# Patient Record
Sex: Male | Born: 1950 | Race: White | Hispanic: No | Marital: Married | Smoking: Never smoker
Health system: Southern US, Community
[De-identification: ages and names within clinical notes are randomized; demographics above are authoritative.]

## PROBLEM LIST (undated history)

## (undated) DIAGNOSIS — M7989 Other specified soft tissue disorders: Secondary | ICD-10-CM

## (undated) DIAGNOSIS — L0390 Cellulitis, unspecified: Secondary | ICD-10-CM

## (undated) DIAGNOSIS — F1011 Alcohol abuse, in remission: Secondary | ICD-10-CM

## (undated) DIAGNOSIS — Z860101 Personal history of adenomatous and serrated colon polyps: Secondary | ICD-10-CM

## (undated) DIAGNOSIS — F1010 Alcohol abuse, uncomplicated: Secondary | ICD-10-CM

## (undated) DIAGNOSIS — L111 Transient acantholytic dermatosis [Grover]: Secondary | ICD-10-CM

## (undated) DIAGNOSIS — T7840XA Allergy, unspecified, initial encounter: Secondary | ICD-10-CM

## (undated) DIAGNOSIS — K4021 Bilateral inguinal hernia, without obstruction or gangrene, recurrent: Secondary | ICD-10-CM

## (undated) DIAGNOSIS — K649 Unspecified hemorrhoids: Secondary | ICD-10-CM

## (undated) DIAGNOSIS — Z8489 Family history of other specified conditions: Secondary | ICD-10-CM

## (undated) DIAGNOSIS — Z7901 Long term (current) use of anticoagulants: Secondary | ICD-10-CM

## (undated) DIAGNOSIS — Z86010 Personal history of colonic polyps: Secondary | ICD-10-CM

## (undated) DIAGNOSIS — N401 Enlarged prostate with lower urinary tract symptoms: Secondary | ICD-10-CM

## (undated) DIAGNOSIS — B019 Varicella without complication: Secondary | ICD-10-CM

## (undated) DIAGNOSIS — G2581 Restless legs syndrome: Secondary | ICD-10-CM

## (undated) DIAGNOSIS — I4891 Unspecified atrial fibrillation: Secondary | ICD-10-CM

## (undated) DIAGNOSIS — C44729 Squamous cell carcinoma of skin of left lower limb, including hip: Secondary | ICD-10-CM

## (undated) DIAGNOSIS — L409 Psoriasis, unspecified: Secondary | ICD-10-CM

## (undated) DIAGNOSIS — M1990 Unspecified osteoarthritis, unspecified site: Secondary | ICD-10-CM

## (undated) DIAGNOSIS — B029 Zoster without complications: Secondary | ICD-10-CM

## (undated) DIAGNOSIS — L719 Rosacea, unspecified: Secondary | ICD-10-CM

## (undated) DIAGNOSIS — Z85828 Personal history of other malignant neoplasm of skin: Secondary | ICD-10-CM

## (undated) HISTORY — PX: MOHS SURGERY: SUR867

## (undated) HISTORY — DX: Allergy, unspecified, initial encounter: T78.40XA

## (undated) HISTORY — PX: TONSILLECTOMY AND ADENOIDECTOMY: SUR1326

## (undated) HISTORY — DX: Zoster without complications: B02.9

## (undated) HISTORY — PX: VEIN LIGATION: SHX2652

## (undated) HISTORY — DX: Varicella without complication: B01.9

## (undated) HISTORY — PX: APPENDECTOMY: SHX54

## (undated) HISTORY — DX: Rosacea, unspecified: L71.9

## (undated) HISTORY — PX: COLONOSCOPY: SHX174

## (undated) HISTORY — DX: Personal history of adenomatous and serrated colon polyps: Z86.0101

## (undated) HISTORY — DX: Personal history of colonic polyps: Z86.010

## (undated) HISTORY — DX: Personal history of other malignant neoplasm of skin: Z85.828

## (undated) HISTORY — DX: Cellulitis, unspecified: L03.90

## (undated) HISTORY — PX: POLYPECTOMY: SHX149

## (undated) HISTORY — DX: Psoriasis, unspecified: L40.9

## (undated) HISTORY — DX: Transient acantholytic dermatosis (grover): L11.1

---

## 1998-10-04 ENCOUNTER — Ambulatory Visit (HOSPITAL_COMMUNITY): Admission: RE | Admit: 1998-10-04 | Discharge: 1998-10-04 | Payer: Self-pay | Admitting: Unknown Physician Specialty

## 1999-01-16 ENCOUNTER — Encounter: Admission: RE | Admit: 1999-01-16 | Discharge: 1999-01-18 | Payer: Self-pay

## 2000-06-12 ENCOUNTER — Encounter: Admission: RE | Admit: 2000-06-12 | Discharge: 2000-06-12 | Payer: Self-pay | Admitting: Otolaryngology

## 2000-06-12 ENCOUNTER — Encounter: Payer: Self-pay | Admitting: Otolaryngology

## 2001-05-01 ENCOUNTER — Emergency Department (HOSPITAL_COMMUNITY): Admission: EM | Admit: 2001-05-01 | Discharge: 2001-05-01 | Payer: Self-pay | Admitting: Emergency Medicine

## 2001-06-15 ENCOUNTER — Encounter: Admission: RE | Admit: 2001-06-15 | Discharge: 2001-09-13 | Payer: Self-pay

## 2001-07-21 ENCOUNTER — Encounter: Admission: RE | Admit: 2001-07-21 | Discharge: 2001-08-11 | Payer: Self-pay | Admitting: Orthopedic Surgery

## 2003-01-10 ENCOUNTER — Encounter: Admission: RE | Admit: 2003-01-10 | Discharge: 2003-02-10 | Payer: Self-pay | Admitting: Orthopedic Surgery

## 2003-01-31 ENCOUNTER — Encounter: Payer: Self-pay | Admitting: Orthopedic Surgery

## 2003-01-31 ENCOUNTER — Ambulatory Visit (HOSPITAL_COMMUNITY): Admission: RE | Admit: 2003-01-31 | Discharge: 2003-01-31 | Payer: Self-pay | Admitting: Orthopedic Surgery

## 2004-10-07 ENCOUNTER — Emergency Department (HOSPITAL_COMMUNITY): Admission: EM | Admit: 2004-10-07 | Discharge: 2004-10-07 | Payer: Self-pay | Admitting: Family Medicine

## 2004-10-08 ENCOUNTER — Ambulatory Visit: Payer: Self-pay | Admitting: Internal Medicine

## 2005-02-21 ENCOUNTER — Ambulatory Visit (HOSPITAL_COMMUNITY): Admission: RE | Admit: 2005-02-21 | Discharge: 2005-02-21 | Payer: Self-pay | Admitting: Urology

## 2005-11-19 ENCOUNTER — Ambulatory Visit: Payer: Self-pay | Admitting: Internal Medicine

## 2005-11-22 ENCOUNTER — Ambulatory Visit: Payer: Self-pay | Admitting: Gastroenterology

## 2006-02-19 ENCOUNTER — Ambulatory Visit (HOSPITAL_COMMUNITY): Admission: RE | Admit: 2006-02-19 | Discharge: 2006-02-19 | Payer: Self-pay | Admitting: Gastroenterology

## 2006-02-19 ENCOUNTER — Encounter (INDEPENDENT_AMBULATORY_CARE_PROVIDER_SITE_OTHER): Payer: Self-pay | Admitting: *Deleted

## 2006-02-19 ENCOUNTER — Encounter: Payer: Self-pay | Admitting: Gastroenterology

## 2006-02-21 ENCOUNTER — Ambulatory Visit: Payer: Self-pay | Admitting: Gastroenterology

## 2006-03-31 ENCOUNTER — Emergency Department (HOSPITAL_COMMUNITY): Admission: EM | Admit: 2006-03-31 | Discharge: 2006-03-31 | Payer: Self-pay | Admitting: Family Medicine

## 2006-09-02 ENCOUNTER — Ambulatory Visit: Payer: Self-pay | Admitting: Internal Medicine

## 2006-10-28 HISTORY — PX: HEMORRHOID BANDING: SHX5850

## 2006-11-06 ENCOUNTER — Ambulatory Visit (HOSPITAL_COMMUNITY): Admission: RE | Admit: 2006-11-06 | Discharge: 2006-11-06 | Payer: Self-pay | Admitting: Urology

## 2007-05-24 ENCOUNTER — Emergency Department (HOSPITAL_COMMUNITY): Admission: EM | Admit: 2007-05-24 | Discharge: 2007-05-24 | Payer: Self-pay | Admitting: Family Medicine

## 2007-05-29 ENCOUNTER — Emergency Department (HOSPITAL_COMMUNITY): Admission: EM | Admit: 2007-05-29 | Discharge: 2007-05-29 | Payer: Self-pay | Admitting: Emergency Medicine

## 2007-10-02 ENCOUNTER — Ambulatory Visit: Payer: Self-pay | Admitting: Internal Medicine

## 2007-10-02 LAB — CONVERTED CEMR LAB
Bilirubin Urine: NEGATIVE
Protein, U semiquant: NEGATIVE
Urobilinogen, UA: NEGATIVE
pH: 6.5

## 2008-12-29 ENCOUNTER — Encounter (INDEPENDENT_AMBULATORY_CARE_PROVIDER_SITE_OTHER): Payer: Self-pay | Admitting: *Deleted

## 2009-03-29 ENCOUNTER — Telehealth: Payer: Self-pay | Admitting: Gastroenterology

## 2009-04-27 ENCOUNTER — Ambulatory Visit: Payer: Self-pay | Admitting: Gastroenterology

## 2009-05-04 ENCOUNTER — Encounter: Payer: Self-pay | Admitting: Internal Medicine

## 2009-05-04 ENCOUNTER — Ambulatory Visit: Payer: Self-pay | Admitting: Gastroenterology

## 2009-05-04 ENCOUNTER — Ambulatory Visit (HOSPITAL_COMMUNITY): Admission: RE | Admit: 2009-05-04 | Discharge: 2009-05-04 | Payer: Self-pay | Admitting: Gastroenterology

## 2009-05-04 ENCOUNTER — Encounter: Payer: Self-pay | Admitting: Gastroenterology

## 2009-05-08 ENCOUNTER — Encounter: Payer: Self-pay | Admitting: Gastroenterology

## 2009-06-19 ENCOUNTER — Emergency Department (HOSPITAL_COMMUNITY): Admission: EM | Admit: 2009-06-19 | Discharge: 2009-06-19 | Payer: Self-pay | Admitting: Emergency Medicine

## 2009-06-28 ENCOUNTER — Emergency Department (HOSPITAL_COMMUNITY): Admission: EM | Admit: 2009-06-28 | Discharge: 2009-06-28 | Payer: Self-pay | Admitting: Family Medicine

## 2009-07-27 ENCOUNTER — Ambulatory Visit: Payer: Self-pay | Admitting: Internal Medicine

## 2009-07-27 DIAGNOSIS — G2581 Restless legs syndrome: Secondary | ICD-10-CM

## 2009-07-28 ENCOUNTER — Encounter (INDEPENDENT_AMBULATORY_CARE_PROVIDER_SITE_OTHER): Payer: Self-pay | Admitting: *Deleted

## 2009-10-13 ENCOUNTER — Telehealth (INDEPENDENT_AMBULATORY_CARE_PROVIDER_SITE_OTHER): Payer: Self-pay | Admitting: *Deleted

## 2009-10-17 ENCOUNTER — Encounter: Payer: Self-pay | Admitting: Internal Medicine

## 2010-01-15 ENCOUNTER — Telehealth (INDEPENDENT_AMBULATORY_CARE_PROVIDER_SITE_OTHER): Payer: Self-pay | Admitting: *Deleted

## 2010-08-27 ENCOUNTER — Ambulatory Visit: Payer: Self-pay | Admitting: Internal Medicine

## 2010-08-27 DIAGNOSIS — R209 Unspecified disturbances of skin sensation: Secondary | ICD-10-CM

## 2010-08-28 ENCOUNTER — Encounter: Payer: Self-pay | Admitting: Internal Medicine

## 2010-08-28 ENCOUNTER — Ambulatory Visit (HOSPITAL_COMMUNITY): Admission: RE | Admit: 2010-08-28 | Discharge: 2010-08-28 | Payer: Self-pay | Admitting: Internal Medicine

## 2010-08-28 LAB — CONVERTED CEMR LAB
TSH: 0.89 microintl units/mL (ref 0.35–5.50)
Vitamin B-12: 988 pg/mL — ABNORMAL HIGH (ref 211–911)

## 2010-08-31 ENCOUNTER — Telehealth: Payer: Self-pay | Admitting: Internal Medicine

## 2010-09-12 ENCOUNTER — Encounter
Admission: RE | Admit: 2010-09-12 | Discharge: 2010-10-18 | Payer: Self-pay | Source: Home / Self Care | Attending: Internal Medicine | Admitting: Internal Medicine

## 2010-09-13 ENCOUNTER — Encounter: Payer: Self-pay | Admitting: Internal Medicine

## 2010-10-16 ENCOUNTER — Encounter: Payer: Self-pay | Admitting: Internal Medicine

## 2010-11-27 NOTE — Letter (Signed)
Summary: [facility]  [facility]   Imported By: Lanelle Bal 12/08/2009 09:41:59  _____________________________________________________________________  External Attachment:    Type:   Image     Comment:   External Document

## 2010-11-27 NOTE — Miscellaneous (Signed)
Summary: Orders Update  Clinical Lists Changes  Orders: Added new Service order of EKG w/ Interpretation (93000) - Signed 

## 2010-11-27 NOTE — Assessment & Plan Note (Signed)
Summary: numbness at bend of elbow/cbs   Vital Signs:  Patient profile:   60 year old male Height:      73 inches Weight:      192.4 pounds BMI:     25.48 Temp:     97.6 degrees F oral Pulse rate:   64 / minute Resp:     15 per minute BP sitting:   118 / 84  (left arm) Cuff size:   large  Vitals Entered By: Shonna Chock CMA (August 27, 2010 10:38 AM) CC: Tingling on left side of body(arm/foot/jaw) since Wed, patient denies chest pain, Shoulder pain   CC:  Tingling on left side of body(arm/foot/jaw) since Wed, patient denies chest pain, and Shoulder pain.  History of Present Illness: Tingling LUE  from antecubital area to deltoid  for 5-10 seconds ( in office it traveled forearm for 1st time)  & jaw ( separately & also with LUE symptoms occasionallyfor > 2 years) intermittently  X 5 days. The patient denies numbness, weakness, locking, stiffness, impaired ROM, swelling, and redness.   The pain began without injury.  The pain has no relievers;the pain is worse with  L elbow flexion intermittently. He has had tingling related to using bite block.    Current Medications (verified): 1)  Finasteride 5 Mg  Tabs (Finasteride) .Marland Kitchen.. 1 By Mouth Once Daily Need To Check On Dosage 2)  Multivitamin .... Once Daily 3)  Asa 81mg  .... 1 By Mouth Qd 4)  Pramipexole Dihydrochloride 0.25  Mg Tabs .Marland KitchenMarland Kitchen. 1 At Bedtime 5)  Uroxatral 10 Mg Xr24h-Tab (Alfuzosin Hcl) .Marland Kitchen.. 1 By Mouth Once Daily  Allergies (verified): No Known Drug Allergies  Review of Systems CV:  Denies chest pain or discomfort, lightheadness, and near fainting. GU:  Denies incontinence. Derm:  Denies lesion(s) and rash. Neuro:  Denies brief paralysis and headaches.  Physical Exam  General:  Well-developed,well-nourished,in no acute distress; alert,appropriate and cooperative throughout examination Eyes:  No corneal or conjunctival inflammation noted. EOMI. Perrla. Field of  Vision grossly normal. MINIMAL ptosis OS Mouth:  Oral  mucosa and oropharynx without lesions or exudates.  Teeth in good repair. No tongue deviation Neck:  No deformities, masses, or tenderness noted. Full ROM Lungs:  Normal respiratory effort, chest expands symmetrically. Lungs are clear to auscultation, no crackles or wheezes. Heart:  Normal rate and regular rhythm. Loud S2  @ R base without gallop, murmur, click, rub or other extra sounds. Msk:  No deformity or scoliosis noted of thoracic or lumbar spine but R thoracic muscles > L.   Pulses:  R and L carotid,radial  pulses are full and equal bilaterally Extremities:  No clubbing, cyanosis, edema.Normal full range of motion of  UE Neurologic:  alert & oriented X3, cranial nerves II-XII intact, strength normal in all extremities, sensation intact to light touch, gait normal, and DTRs symmetrical and normal.   Skin:  darier's lesions which blanche  Cervical Nodes:  No lymphadenopathy noted Axillary Nodes:  No palpable lymphadenopathy Psych:  memory intact for recent and remote, normally interactive, and good eye contact.     Impression & Recommendations:  Problem # 1:  TINGLING (ICD-782.0)  T1 distribution X 5 days ; C 2 X 2 years  Orders: T-Cervicle Spine 2-3 Views [id number]) T-Thoracic Spine 2 Views [id number]) Venipuncture [id number]) TLB-TSH (Thyroid Stimulating Hormone) (84443-TSH) TLB-B12, Serum-Total ONLY (81829-H37) T-RPR (Syphilis) (16967-89381)  Complete Medication List: 1)  Finasteride 5 Mg Tabs (Finasteride) .Marland Kitchen.. 1 by mouth once daily need to  check on dosage 2)  Multivitamin  .... Once daily 3)  Asa 81mg   .... 1 by mouth qd 4)  Pramipexole Dihydrochloride 0.25 Mg Tabs  .Marland Kitchen.. 1 at bedtime 5)  Uroxatral 10 Mg Xr24h-tab (Alfuzosin hcl) .Marland Kitchen.. 1 by mouth once daily  Patient Instructions: 1)  If tests & Xrays are negative; Neuro  referral & ? EMG/NCT    Orders Added: 1)  Est. Patient Level III [60454] 2)  T-Cervicle Spine 2-3 Views [72040TC] 3)  T-Thoracic Spine 2 Views  [72070TC] 4)  Venipuncture [09811] 5)  TLB-TSH (Thyroid Stimulating Hormone) [84443-TSH] 6)  TLB-B12, Serum-Total ONLY [82607-B12] 7)  T-RPR (Syphilis) [91478-29562]   Immunization History:  Influenza Immunization History:    Influenza:  historical (06/28/2010)   Immunization History:  Influenza Immunization History:    Influenza:  Historical (06/28/2010)

## 2010-11-27 NOTE — Progress Notes (Signed)
Summary: Radiology Results  Phone Note Outgoing Call Call back at [facility] Phone [phone]   Call placed by: Shonna Chock CMA,  August 31, 2010 11:39 AM Call placed to: Patient Summary of Call: Left message on machine for patient to return call when avaliable, Reason for call:  These are significant changes which certainly could cause intermittent nerve root impingement. I would recommend Neurology evaluation to assess this possibility as there is no definite Neurosurgical condition present with the intermittent symptoms. I had C-6 issue for which I saw Lesia Sago. Hopp  Mild  curvature (scoliosis) in upper thoracic spine  & degenerative("wear & tear" ) changes , especially in lower portion of thoracic spine.These anatomical factors may lead to positionally induced nerve root irritation. See cervical spine repert also. Hopp     Initial call taken by: Shonna Chock CMA,  August 31, 2010 11:39 AM  Follow-up for Phone Call        Patient called back and left message to call him @ [phone] (cell)  Spoke with patient,  aware of results and indicated he thinks Rehab would be his first choice [facility]) and then Neuro referral if needed.  Dr.Hopper please advise if you agree Follow-up by: Shonna Chock CMA,  September 03, 2010 4:29 PM

## 2010-11-27 NOTE — Progress Notes (Signed)
Summary: Refill Request  Phone Note Refill Request Message from:  Pharmacy on January 15, 2010 4:47 PM  Refills Requested: Medication #1:  PRAMIPEXOLE DIHYDROCHLORIDE 0.25  MG TABS 1 at bedtime.   Last Refilled: 08/24/2009  Method Requested: Fax to Local Pharmacy Next Appointment Scheduled: No future Appointments Initial call taken by: Barnie Mort,  January 15, 2010 4:48 PM    Prescriptions: PRAMIPEXOLE DIHYDROCHLORIDE 0.25  MG TABS 1 at bedtime  #90 x 1   Entered by:   Shonna Chock   Authorized by:   Marga Melnick MD   Signed by:   Shonna Chock on 01/16/2010   Method used:   Faxed to ...       [facility] (mail-order)       [address]       [place], [place]  [address]       Ph: [phone]       Fax: [phone]   RxID:   [id number]

## 2010-11-27 NOTE — Miscellaneous (Signed)
Summary: PT Initial Summary/[facility]  PT Initial [facility]   Imported By: Lanelle Bal 09/25/2010 10:47:48  _____________________________________________________________________  External Attachment:    Type:   Image     Comment:   External Document

## 2010-11-29 NOTE — Letter (Signed)
Summary: [facility]  [facility]   Imported By: Lanelle Bal 10/31/2010 10:55:20  _____________________________________________________________________  External Attachment:    Type:   Image     Comment:   External Document

## 2010-11-29 NOTE — Letter (Signed)
Summary: [facility]  [facility]   Imported By: Lanelle Bal 11/06/2010 08:55:02  _____________________________________________________________________  External Attachment:    Type:   Image     Comment:   External Document

## 2010-11-30 NOTE — Letter (Signed)
Summary: [facility]  [facility]   Imported By: Lanelle Bal 11/14/2009 12:37:39  _____________________________________________________________________  External Attachment:    Type:   Image     Comment:   External Document

## 2011-02-01 LAB — POCT RAPID STREP A (OFFICE): Streptococcus, Group A Screen (Direct): NEGATIVE

## 2011-02-11 ENCOUNTER — Inpatient Hospital Stay (INDEPENDENT_AMBULATORY_CARE_PROVIDER_SITE_OTHER)
Admission: RE | Admit: 2011-02-11 | Discharge: 2011-02-11 | Disposition: A | Discharge status: Home / Self Care | Payer: 59 | Source: Ambulatory Visit | Attending: Family Medicine | Admitting: Family Medicine

## 2011-02-11 DIAGNOSIS — L255 Unspecified contact dermatitis due to plants, except food: Secondary | ICD-10-CM

## 2011-03-01 ENCOUNTER — Ambulatory Visit: Payer: 59 | Attending: Internal Medicine | Admitting: Audiology

## 2011-03-01 DIAGNOSIS — H918X9 Other specified hearing loss, unspecified ear: Secondary | ICD-10-CM | POA: Insufficient documentation

## 2011-03-13 ENCOUNTER — Other Ambulatory Visit: Payer: Self-pay | Admitting: Dermatology

## 2011-03-15 NOTE — Op Note (Signed)
John Barrett, BUSTA NO.:  [id number]   MEDICAL RECORD NO.:  [id number]          PATIENT TYPE:  OUT   LOCATION:  VASC                         FACILITY:  [facility]   PHYSICIAN:  Jamison Neighbor, M.D.  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   PROCEDURE:  Vascular lab testing with prostaglandin injection.   SURGEON:  Jamison Neighbor, M.D.   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  The patient previously was evaluated and treated by Dr.  Lurline Idol, who at that time was at [facility] but is currently the [place] at  the [facility]  The patient had venous leak surgery with  ligation of the veins emanating from the penis.  At first, this worked well  but the patient has had recurrence of the venous leak.  Patient appears to  have excellent arterial inflow but has problems with erectile dysfunction  secondary to the leak of blood out of the penis during erections.  The  patient is now to undergo testing to determine if that is indeed the case.  He understands the risks and benefits of the procedure including the  possibility that he will have pain or discomfort at the injection site as  well as the possibility he could have a prolonged erection following  prostaglandin injection.  Full and informed consent was obtained.   DESCRIPTION OF PROCEDURE:  The patient was evaluated with the ultrasound  probe prior to injection of prostaglandin.  He had systolic pressure of 38.2  on the right and 36 on the left with no diastolic pressure.  The patient  anatomically appeared normal.  There were no signs of Peyronie's plaques  or  other calcification.  The corporeal bodies were unremarkable.  A good prompt  arterial flow was noted even without injection.  Immediately post injection,  the pressure on the right side was 31.9, the diastolic pressure was 10.4; on  the left there was a nice increase in pressure to 64.2 with diastolic  pressure of 17.2.  Five minutes after injection, the right pressure had  increased to 62.6, diastolic pressure remained higher than expected at 13.3;  on the left-hand side, the pressure increased all the way up to 166 but  diastolic pressure was elevated at 42.1.  At 10 minutes there was still  systolic pressure of 48.8 but elevated diastolic pressure of 15.2, on the  left 72.9 and 21.4 were noted.  At 15 minutes following injection it was  still good inflow noted but the diastolic pressures remain elevated.  The  anatomical evaluation of the vascular system of the penis showed what  appeared to be normal.  __________ but clear cut evidence of venous leak  with large dilated veins draining blood out during the diastolic cycle.  It  was clear that this was suggestive of venous leak disease.  The findings  will be discussed with the patient and options will be reviewed.      RJE/MEDQ  D:  02/26/2005  T:  02/26/2005  Job:  [id number]

## 2011-04-08 ENCOUNTER — Telehealth: Payer: Self-pay | Admitting: Internal Medicine

## 2011-04-08 MED ORDER — PRAMIPEXOLE DIHYDROCHLORIDE 0.25 MG PO TABS: 0.2500 mg | ORAL_TABLET | Freq: Every day | ORAL | Status: DC

## 2011-04-08 NOTE — Telephone Encounter (Signed)
done

## 2011-09-04 ENCOUNTER — Ambulatory Visit (INDEPENDENT_AMBULATORY_CARE_PROVIDER_SITE_OTHER): Payer: 59 | Admitting: Psychology

## 2011-09-04 DIAGNOSIS — F341 Dysthymic disorder: Secondary | ICD-10-CM

## 2011-09-17 ENCOUNTER — Ambulatory Visit (INDEPENDENT_AMBULATORY_CARE_PROVIDER_SITE_OTHER): Payer: 59 | Admitting: Psychology

## 2011-09-17 DIAGNOSIS — F341 Dysthymic disorder: Secondary | ICD-10-CM

## 2011-09-23 ENCOUNTER — Ambulatory Visit (INDEPENDENT_AMBULATORY_CARE_PROVIDER_SITE_OTHER): Payer: 59 | Admitting: Psychology

## 2011-09-23 DIAGNOSIS — F341 Dysthymic disorder: Secondary | ICD-10-CM

## 2011-10-08 ENCOUNTER — Other Ambulatory Visit: Payer: Self-pay | Admitting: Internal Medicine

## 2011-10-08 ENCOUNTER — Ambulatory Visit (INDEPENDENT_AMBULATORY_CARE_PROVIDER_SITE_OTHER): Payer: 59 | Admitting: Psychology

## 2011-10-08 DIAGNOSIS — F341 Dysthymic disorder: Secondary | ICD-10-CM

## 2011-10-08 MED ORDER — PRAMIPEXOLE DIHYDROCHLORIDE 0.25 MG PO TABS: 0.2500 mg | ORAL_TABLET | Freq: Every day | ORAL | Status: DC

## 2011-10-08 NOTE — Telephone Encounter (Signed)
Patient will need to schedule a CPX  

## 2011-10-24 ENCOUNTER — Ambulatory Visit (INDEPENDENT_AMBULATORY_CARE_PROVIDER_SITE_OTHER): Payer: 59 | Admitting: Psychology

## 2011-10-24 DIAGNOSIS — F341 Dysthymic disorder: Secondary | ICD-10-CM

## 2011-11-14 ENCOUNTER — Ambulatory Visit: Payer: 59 | Admitting: Psychology

## 2011-11-26 ENCOUNTER — Ambulatory Visit (INDEPENDENT_AMBULATORY_CARE_PROVIDER_SITE_OTHER): Payer: 59 | Admitting: Psychology

## 2011-11-26 DIAGNOSIS — F341 Dysthymic disorder: Secondary | ICD-10-CM

## 2011-12-11 ENCOUNTER — Other Ambulatory Visit: Payer: Self-pay | Admitting: Dermatology

## 2011-12-12 ENCOUNTER — Ambulatory Visit: Payer: 59 | Admitting: Psychology

## 2012-02-21 ENCOUNTER — Emergency Department (HOSPITAL_COMMUNITY)
Admission: EM | Admit: 2012-02-21 | Discharge: 2012-02-21 | Disposition: A | Discharge status: Home / Self Care | Payer: 59 | Source: Home / Self Care | Attending: Emergency Medicine | Admitting: Emergency Medicine

## 2012-02-21 ENCOUNTER — Encounter (HOSPITAL_COMMUNITY): Payer: Self-pay | Admitting: *Deleted

## 2012-02-21 DIAGNOSIS — T148XXA Other injury of unspecified body region, initial encounter: Secondary | ICD-10-CM

## 2012-02-21 DIAGNOSIS — Z23 Encounter for immunization: Secondary | ICD-10-CM

## 2012-02-21 MED ORDER — CEPHALEXIN 500 MG PO CAPS: 500.0000 mg | ORAL_CAPSULE | Freq: Three times a day (TID) | ORAL | Status: AC

## 2012-02-21 MED ORDER — TETANUS-DIPHTH-ACELL PERTUSSIS 5-2.5-18.5 LF-MCG/0.5 IM SUSP
0.5000 mL | Freq: Once | INTRAMUSCULAR | Status: AC
  Administered 2012-02-21: 0.5 mL via INTRAMUSCULAR

## 2012-02-21 MED ORDER — TETANUS-DIPHTH-ACELL PERTUSSIS 5-2.5-18.5 LF-MCG/0.5 IM SUSP
INTRAMUSCULAR | Status: AC
  Filled 2012-02-21: qty 0.5

## 2012-02-21 NOTE — ED Provider Notes (Signed)
Chief Complaint  Patient presents with  . Puncture Wound    History of Present Illness:   John Barrett is a 61 year old male this evening, stepped on a nail while walking in his garage barefoot to get something. He estimates the nail penetrated about an inch into the anterior part of the foot. He has a small puncture wound that is not very tender. There is no redness or drainage. He comes in to get a tetanus shot, he cannot recall when his last one was. He is able to move all his toes and has no numbness or tingling in the foot or the toes.  Review of Systems:  Other than noted above, the patient denies any of the following symptoms: Systemic:  No fevers, chills, sweats, or aches.  No fatigue or tiredness. Musculoskeletal:  No joint pain, arthritis, bursitis, swelling, back pain, or neck pain. Neurological:  No muscular weakness, paresthesias, headache, or trouble with speech or coordination.  No dizziness.   PMFSH:  Past medical history, family history, social history, meds, and allergies were reviewed.  Physical Exam:   Vital signs:  BP 122/71  Pulse 67  Temp(Src) 98.6 F (37 C) (Oral)  Resp 18  SpO2 100% Gen:  Alert and oriented times 3.  In no distress. Musculoskeletal: There is a small puncture wound in the anterior portion of the right foot. There is no surrounding erythema, no induration, no tenderness to palpation. There is no purulent drainage. He is able to move all his toes well. Sensation is intact, and pulses are full. Otherwise, all joints had a full a ROM with no swelling, bruising or deformity.  No edema, pulses full. Extremities were warm and pink.  Capillary refill was brisk.  Skin:  Clear, warm and dry.  No rash. Neuro:  Alert and oriented times 3.  Muscle strength was normal.  Sensation was intact to light touch.   Course in Urgent Care Center:   He was given a DTaP vaccine and tolerated this well without any immediate side effects.  Assessment:  The encounter diagnosis  was Puncture wound.  Plan:   1.  The following meds were prescribed:   New Prescriptions   CEPHALEXIN (KEFLEX) 500 MG CAPSULE    Take 1 capsule (500 mg total) by mouth 3 (three) times daily.   2.  The patient was instructed in symptomatic care, including rest and activity, elevation, application of ice and compression.  Appropriate handouts were given. 3.  The patient was told to return if becoming worse in any way, if no better in 3 or 4 days, and given some red flag symptoms that would indicate earlier return.   4.  The patient was told to follow up either here or with his primary care physician if there is any sign of infection such as erythema, pain, drainage, fever, or a red streak running up his leg. He was given a prescription for cephalexin just in case it should start to look a little infected. He was instructed in wound care.   Reuben Likes, MD 02/21/12 2003

## 2012-02-21 NOTE — ED Notes (Signed)
tdap  0.5 ml   l  Arm  1  Att   [id number]    boostrix      [id number]

## 2012-02-21 NOTE — ED Notes (Signed)
Pt  States    Stepped  On a  Nail  Today  While  Working  In garage      sm  Puncture  Wound  Present        No  Apparent  fb               denys  Any  Other  injurys        Pt  States ast  Tetanus  Shot  About 15  Years  Ago

## 2012-02-21 NOTE — Discharge Instructions (Signed)
Stab Wound A stab wound can cause infection, bleeding and damage to organs and tissues in the area of the wound. Care must be taken for complete recovery. Much of the time stab wounds can be treated by cleaning them and applying a sterile dressing. Sutures, skin adhesive strips or staples may be used to close some stab wounds.  HOME CARE INSTRUCTIONS  Rest your injury for the next 2-3 days to reduce pain and lessen the risk of infection.   Keep your wound clean and dry and dress as instructed by your caregiver.  You might need a tetanus shot now if:  You have no idea when you had the last one.   You have never had a tetanus shot before.   Your wound had dirt in it.  If you need a tetanus shot, and you choose not to get one, there is a rare chance of getting tetanus. Sickness from tetanus can be serious. If you got a tetanus shot, your arm may swell, get red and warm to the touch at the shot site. This is common and not a problem. SEEK IMMEDIATE MEDICAL CARE IF:  You develop increasing pain, redness or swelling around the wound.   You have nausea or vomiting.   There is increased bleeding from the wound.   You develop numbness or weakness in the injured area. This can be due to damage to an underlying nerve or tendon.   There is a pus-like discharge from the wound.   You have chills or an elevated temperature above 102 F (38.9 C).   You have shortness of breath or fainting.  Document Released: 11/21/2004 Document Revised: 10/03/2011 Document Reviewed: 08/03/2008 [facility] Patient Information 2012 [place], [place].   Watch for signs of infection (redness, swelling, pus drainage, fever, red streak) and return right away if any problem.  Wash with soap and water and apply antibiotic ointment twice daily.  You got a tetanus shot today which should last until you're 71.

## 2012-04-24 NOTE — Progress Notes (Signed)
[facility]  NAME:  LINK, BURGESON NO.:  [id number]  MEDICAL RECORD NO.:  [id number]      DATE OF BIRTH:  [birth year]  PHYSICIAN:  Ardath Sax, M.D.           VISIT DATE:                                  OFFICE VISIT   Dear Mr. Lessie Dings:  I am one of the physicians at the [facility] and I am writing you after receiving information that Alvester Chou will be leaving the leadership of the Wound Center.  I feel he has been an integral part of the success of the clinic and I feel that this will be a great loss to the [facility].  Everyone on our staff feels that he ran an excellent unit and always was there when problems arose.  It is our hope that this situation will be resolved and Mr. Claud Kelp will be retained.  Thank you for your consideration.     Ardath Sax, M.D.     PP/MEDQ  D:  04/24/2012  T:  04/24/2012  Job:  [id number]

## 2012-07-19 ENCOUNTER — Emergency Department (HOSPITAL_BASED_OUTPATIENT_CLINIC_OR_DEPARTMENT_OTHER)
Admission: EM | Admit: 2012-07-19 | Discharge: 2012-07-19 | Disposition: A | Discharge status: Home / Self Care | Payer: 59 | Attending: Emergency Medicine | Admitting: Emergency Medicine

## 2012-07-19 ENCOUNTER — Encounter (HOSPITAL_BASED_OUTPATIENT_CLINIC_OR_DEPARTMENT_OTHER): Payer: Self-pay | Admitting: *Deleted

## 2012-07-19 DIAGNOSIS — L02419 Cutaneous abscess of limb, unspecified: Secondary | ICD-10-CM | POA: Insufficient documentation

## 2012-07-19 DIAGNOSIS — W57XXXA Bitten or stung by nonvenomous insect and other nonvenomous arthropods, initial encounter: Secondary | ICD-10-CM | POA: Insufficient documentation

## 2012-07-19 DIAGNOSIS — L089 Local infection of the skin and subcutaneous tissue, unspecified: Secondary | ICD-10-CM | POA: Insufficient documentation

## 2012-07-19 DIAGNOSIS — Z7982 Long term (current) use of aspirin: Secondary | ICD-10-CM | POA: Insufficient documentation

## 2012-07-19 DIAGNOSIS — L03116 Cellulitis of left lower limb: Secondary | ICD-10-CM

## 2012-07-19 DIAGNOSIS — IMO0002 Reserved for concepts with insufficient information to code with codable children: Secondary | ICD-10-CM

## 2012-07-19 MED ORDER — CEPHALEXIN 500 MG PO CAPS: 500.0000 mg | ORAL_CAPSULE | Freq: Four times a day (QID) | ORAL | Status: DC

## 2012-07-19 MED ORDER — CEPHALEXIN 250 MG PO CAPS
500.0000 mg | ORAL_CAPSULE | Freq: Once | ORAL | Status: AC
  Administered 2012-07-19: 500 mg via ORAL
  Filled 2012-07-19: qty 2

## 2012-07-19 NOTE — ED Notes (Signed)
Ice pack placed to left lower leg and warm blankets given.

## 2012-07-19 NOTE — ED Notes (Signed)
Pt states he was ? Stung by a wasp yesterday on his left lower leg. Now presents with increased redness and swelling. Warm to touch.

## 2012-07-19 NOTE — ED Provider Notes (Addendum)
History   This chart was scribed for Carleene Cooper III, MD by Sofie Rower. The patient was seen in room MH11/MH11 and the patient's care was started at 7:40PM.     CSN: [id number]  Arrival date & time 07/19/12  7829   First MD Initiated Contact with Patient 07/19/12 1940      Chief Complaint  Patient presents with  . Insect Bite    (Consider location/radiation/quality/duration/timing/severity/associated sxs/prior treatment) Patient is a 61 y.o. male presenting with allergic reaction. The history is provided by the patient. No language interpreter was used.  Allergic Reaction The primary symptoms do not include nausea, vomiting, dizziness or altered mental status. The current episode started yesterday. The problem has been gradually worsening. This is a new problem.  The onset of the reaction was associated with insect bite/sting (Sting by a wasp. ). Significant symptoms that are not present include rhinorrhea or itching. Associated symptoms comments: Swelling and erythema. John Barrett is a 61 y.o. male with a hx of cellulitis (onset mid 1990's, located at the left upper extremity) who presents to the Emergency Department complaining of sudden, progressively worsening, insect bite located at the left lower extremity onset yesterday (07/18/12) with associated symptoms of swelling and erythema. The pt reports he was working in the yard yesterday, 07/18/12, at 1:00PM, where he was suddenly stung by a wasp on his lower left leg. In addition, the pt reports the site of the sting has progressively become increasingly red and swollen. The pt reports he is taking a daily aspirin and Cialis at present. Modifying factors include application of bite swab and benadryl on the left lower extremity which provides moderate relief.  The pt does not smoke, however, he does drink alcohol on occasion.   PCP is Dr. Alwyn Ren.    History reviewed. No pertinent past medical history.  History reviewed. No  pertinent past surgical history.  History reviewed. No pertinent family history.  History  Substance Use Topics  . Smoking status: Not on file  . Smokeless tobacco: Not on file  . Alcohol Use: Yes     occasonal      Review of Systems  HENT: Negative for rhinorrhea.   Gastrointestinal: Negative for nausea and vomiting.  Skin: Negative for itching.  Neurological: Negative for dizziness.  Psychiatric/Behavioral: Negative for altered mental status.  All other systems reviewed and are negative.    Allergies  Review of patient's allergies indicates no known allergies.  Home Medications   Current Outpatient Rx  Name Route Sig Dispense Refill  . ALFUZOSIN HCL ER 10 MG PO TB24 Oral Take 10 mg by mouth daily.    . ASPIRIN 81 MG PO TABS Oral Take 81 mg by mouth daily.    . CEPHALEXIN 500 MG PO CAPS Oral Take 1 capsule (500 mg total) by mouth 4 (four) times daily. 28 capsule 0  . MULTIVITAMINS PO CAPS Oral Take 1 capsule by mouth daily.    Marland Kitchen PRAMIPEXOLE DIHYDROCHLORIDE 0.25 MG PO TABS Oral Take 1 tablet (0.25 mg total) by mouth at bedtime. 90 tablet 0    **APPOINTMENT OVER-DUE**    BP 151/88  Pulse 58  Temp 97.7 F (36.5 C) (Oral)  Resp 20  Ht 6\' 1"  (1.854 m)  Wt 170 lb (77.111 kg)  BMI 22.43 kg/m2  SpO2 96%  Physical Exam  Nursing note and vitals reviewed. Constitutional: He is oriented to person, place, and time. He appears well-developed and well-nourished.  HENT:  Head: Atraumatic.  Right Ear: External ear normal.  Left Ear: External ear normal.  Nose: Nose normal.  Eyes: Conjunctivae normal are normal.  Neck: Normal range of motion.  Musculoskeletal: Normal range of motion.       Insect bite located at the left anterior calf, surrounded by 3 cm cellulitis. No lymphangitis detected.   Neurological: He is alert and oriented to person, place, and time.  Skin: Skin is warm and dry.  Psychiatric: He has a normal mood and affect. His behavior is normal.    ED  Course  Procedures (including critical care time)  DIAGNOSTIC STUDIES: Oxygen Saturation is 96% on room air, normal by my interpretation.    COORDINATION OF CARE:    7:53PM- Management of cellulitis (Keflex 500 mg, 4 times per day), application of ice in order to reduce swelling, and treatment plan concerning possible follow up with Dr. Alwyn Ren if symptoms progress or fever occurs, discussed with patient. Pt agrees with treatment.   Labs Reviewed - No data to display No results found.   1. Infected insect bite of left hip or leg   2. Cellulitis of left lower extremity      I personally performed the services described in this documentation, which was scribed in my presence. The recorded information has been reviewed and considered.  Osvaldo Human, MD     Carleene Cooper III, MD 07/19/12 2006     Carleene Cooper III, MD 07/19/12 2006

## 2012-07-19 NOTE — ED Notes (Signed)
MD bedside

## 2012-08-31 ENCOUNTER — Other Ambulatory Visit: Payer: Self-pay | Admitting: Dermatology

## 2013-02-26 ENCOUNTER — Ambulatory Visit (HOSPITAL_BASED_OUTPATIENT_CLINIC_OR_DEPARTMENT_OTHER)
Admission: RE | Admit: 2013-02-26 | Discharge: 2013-02-26 | Disposition: A | Discharge status: Home / Self Care | Payer: 59 | Source: Ambulatory Visit | Attending: Family Medicine | Admitting: Family Medicine

## 2013-02-26 ENCOUNTER — Encounter: Payer: Self-pay | Admitting: Family Medicine

## 2013-02-26 ENCOUNTER — Ambulatory Visit (INDEPENDENT_AMBULATORY_CARE_PROVIDER_SITE_OTHER): Payer: 59 | Admitting: Family Medicine

## 2013-02-26 VITALS — BP 126/83 | HR 66 | Ht 73.0 in | Wt 175.0 lb

## 2013-02-26 DIAGNOSIS — M25569 Pain in unspecified knee: Secondary | ICD-10-CM | POA: Insufficient documentation

## 2013-02-26 DIAGNOSIS — M25562 Pain in left knee: Secondary | ICD-10-CM

## 2013-02-26 NOTE — Patient Instructions (Addendum)
Get the x-rays of your knee before you leave today - I'll call you with the results. Take tylenol 500mg  1-2 tabs three times a day for pain. Aleve 1-2 tabs twice a day with food for 7 days then as needed. Glucosamine sulfate 750mg  twice a day is a supplement that may help moderate to severe arthritis if you have this on x-rays. Capsaicin topically up to four times a day may also help with pain. Cortisone injections are an option. It's important that you continue to stay active - no restrictions on activities. Call me if your knee starts locking or regularly catching as this could indicate a meniscus fragment flipping into the joint space. Start straight leg raises, straight leg raises with foot turned outward, and hip side raises 3 sets of 10 once a day of each for next 6 weeks. If these become too easy you can add a velcro ankle weight. Consider physical therapy to strengthen muscles around the joint that hurts to take pressure off of the joint itself. Shoe inserts with good arch support may be helpful. Ice 15 minutes at a time 3-4 times a day as needed. Follow up with me in 6 weeks or as needed. If not improving would consider injection, MRI, physical therapy depending on your x-ray results, progress with above treatment.

## 2013-03-02 ENCOUNTER — Encounter: Payer: Self-pay | Admitting: Family Medicine

## 2013-03-02 DIAGNOSIS — M25562 Pain in left knee: Secondary | ICD-10-CM | POA: Insufficient documentation

## 2013-03-02 NOTE — Assessment & Plan Note (Signed)
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  Believe his pain is consistent with this or degenerative lateral meniscus tear.  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  Shown home exercise program.  Tylenol, aleve, glucosamine, capsaicin discussed.  Declined trial of cortisone injection at this time.  Consider formal PT.  Also discussed arch supports.  Icing as needed.  F/u in 6 weeks or as needed.

## 2013-03-02 NOTE — Progress Notes (Signed)
  Subjective:    Patient ID: John Barrett, male    DOB: December 15, 1950, 62 y.o.   MRN: [id number]  PCP: Dr. Alwyn Ren  HPI 62 yo M here for left knee pain.  Patient reports he's struggled with left knee pain over the past year. Noticed primarily during spinning/cycling - would get to about 45 minutes and have to stop because he was developing anterior left knee pain behind kneecap. After getting off the bike pain would subside within about 30 seconds. Over past few weeks has intensified to include lateral knee pain. Especially bothered him [place]. Will feel like the knee is loose at times or things are floating around in knee. Some associated lateral swelling. Ices and will take aleve rarely when very bad.  History reviewed. No pertinent past medical history.  Current Outpatient Prescriptions on File Prior to Visit  Medication Sig Dispense Refill  . alfuzosin (UROXATRAL) 10 MG 24 hr tablet Take 10 mg by mouth daily.      Marland Kitchen aspirin 81 MG tablet Take 81 mg by mouth daily.      . Multiple Vitamin (MULTIVITAMIN) capsule Take 1 capsule by mouth daily.      . pramipexole (MIRAPEX) 0.25 MG tablet Take 1 tablet (0.25 mg total) by mouth at bedtime.  90 tablet  0   No current facility-administered medications on file prior to visit.    History reviewed. No pertinent past surgical history.  No Known Allergies  History   Social History  . Marital Status: Married    Spouse Name: N/A    Number of Children: N/A  . Years of Education: N/A   Occupational History  . Not on file.   Social History Main Topics  . Smoking status: Never Smoker   . Smokeless tobacco: Not on file  . Alcohol Use: Yes     Comment: occasonal  . Drug Use: Not on file  . Sexually Active: Not on file   Other Topics Concern  . Not on file   Social History Narrative  . No narrative on file    Family History  Problem Relation Age of Onset  . Sudden death Neg Hx   .  Hypertension Neg Hx   . Hyperlipidemia Neg Hx   . Heart attack Neg Hx   . Diabetes Neg Hx     BP 126/83  Pulse 66  Ht 6\' 1"  (1.854 m)  Wt 175 lb (79.379 kg)  BMI 23.09 kg/m2  Review of Systems See HPI above.    Objective:   Physical Exam Gen: NAD  L knee: No gross deformity, ecchymoses, effusion.  Mild VMO atrophy TTP lateral joint line reproducing pain.  Minimal post patellar facet tenderness.  No medial joint line tenderness.  FROM. Negative ant/post drawers. Negative valgus/varus testing. Negative lachmanns. Negative mcmurrays, apleys, patellar apprehension, clarkes. NV intact distally.     Assessment & Plan:  1. 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  Believe his pain is consistent with this or degenerative lateral meniscus tear.  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  Shown home exercise program.  Tylenol, aleve, glucosamine, capsaicin discussed.  Declined trial of cortisone injection at this time.  Consider formal PT.  Also discussed arch supports.  Icing as needed.  F/u in 6 weeks or as needed.

## 2013-07-12 ENCOUNTER — Other Ambulatory Visit: Payer: Self-pay | Admitting: Dermatology

## 2014-04-06 ENCOUNTER — Encounter: Payer: Self-pay | Admitting: Gastroenterology

## 2014-05-10 ENCOUNTER — Other Ambulatory Visit: Payer: Self-pay | Admitting: Dermatology

## 2014-07-13 ENCOUNTER — Other Ambulatory Visit (INDEPENDENT_AMBULATORY_CARE_PROVIDER_SITE_OTHER): Payer: Self-pay | Admitting: Surgery

## 2014-07-13 ENCOUNTER — Ambulatory Visit (INDEPENDENT_AMBULATORY_CARE_PROVIDER_SITE_OTHER): Payer: Commercial Managed Care - PPO | Admitting: Surgery

## 2014-07-26 ENCOUNTER — Encounter (HOSPITAL_BASED_OUTPATIENT_CLINIC_OR_DEPARTMENT_OTHER): Payer: Self-pay | Admitting: *Deleted

## 2014-07-29 ENCOUNTER — Ambulatory Visit (HOSPITAL_BASED_OUTPATIENT_CLINIC_OR_DEPARTMENT_OTHER)
Admission: RE | Admit: 2014-07-29 | Discharge: 2014-07-29 | Disposition: A | Discharge status: Home / Self Care | Payer: 59 | Source: Ambulatory Visit | Attending: Surgery | Admitting: Surgery

## 2014-07-29 ENCOUNTER — Ambulatory Visit (HOSPITAL_BASED_OUTPATIENT_CLINIC_OR_DEPARTMENT_OTHER): Payer: 59 | Admitting: Anesthesiology

## 2014-07-29 ENCOUNTER — Encounter (HOSPITAL_BASED_OUTPATIENT_CLINIC_OR_DEPARTMENT_OTHER): Payer: 59 | Admitting: Anesthesiology

## 2014-07-29 ENCOUNTER — Encounter (HOSPITAL_BASED_OUTPATIENT_CLINIC_OR_DEPARTMENT_OTHER): Payer: Self-pay | Admitting: Surgery

## 2014-07-29 ENCOUNTER — Encounter (HOSPITAL_BASED_OUTPATIENT_CLINIC_OR_DEPARTMENT_OTHER)
Admission: RE | Disposition: A | Discharge status: Home / Self Care | Payer: Self-pay | Source: Ambulatory Visit | Attending: Surgery

## 2014-07-29 DIAGNOSIS — M7138 Other bursal cyst, other site: Secondary | ICD-10-CM | POA: Insufficient documentation

## 2014-07-29 DIAGNOSIS — N400 Enlarged prostate without lower urinary tract symptoms: Secondary | ICD-10-CM | POA: Insufficient documentation

## 2014-07-29 DIAGNOSIS — M7989 Other specified soft tissue disorders: Secondary | ICD-10-CM | POA: Diagnosis present

## 2014-07-29 DIAGNOSIS — R229 Localized swelling, mass and lump, unspecified: Secondary | ICD-10-CM | POA: Diagnosis present

## 2014-07-29 HISTORY — DX: Other specified soft tissue disorders: M79.89

## 2014-07-29 HISTORY — PX: MASS EXCISION: SHX2000

## 2014-07-29 LAB — POCT HEMOGLOBIN-HEMACUE: Hemoglobin: 15.7 g/dL (ref 13.0–17.0)

## 2014-07-29 SURGERY — EXCISION MASS
Anesthesia: Monitor Anesthesia Care | Site: Leg Lower | Laterality: Left

## 2014-07-29 MED ORDER — FENTANYL CITRATE 0.05 MG/ML IJ SOLN: 25.0000 ug | INTRAMUSCULAR | Status: DC | PRN

## 2014-07-29 MED ORDER — BUPIVACAINE HCL (PF) 0.5 % IJ SOLN
INTRAMUSCULAR | Status: DC | PRN
  Administered 2014-07-29: 10 mL

## 2014-07-29 MED ORDER — ONDANSETRON HCL 4 MG/2ML IJ SOLN
INTRAMUSCULAR | Status: DC | PRN
  Administered 2014-07-29: 4 mg via INTRAVENOUS

## 2014-07-29 MED ORDER — MIDAZOLAM HCL 2 MG/2ML IJ SOLN: 1.0000 mg | INTRAMUSCULAR | Status: DC | PRN

## 2014-07-29 MED ORDER — OXYCODONE HCL 5 MG/5ML PO SOLN: 5.0000 mg | Freq: Once | ORAL | Status: DC | PRN

## 2014-07-29 MED ORDER — CEFAZOLIN SODIUM-DEXTROSE 2-3 GM-% IV SOLR
2.0000 g | INTRAVENOUS | Status: AC
  Administered 2014-07-29: 2 g via INTRAVENOUS

## 2014-07-29 MED ORDER — FENTANYL CITRATE 0.05 MG/ML IJ SOLN: 50.0000 ug | INTRAMUSCULAR | Status: DC | PRN

## 2014-07-29 MED ORDER — CEFAZOLIN SODIUM-DEXTROSE 2-3 GM-% IV SOLR
INTRAVENOUS | Status: AC
  Filled 2014-07-29: qty 50

## 2014-07-29 MED ORDER — MIDAZOLAM HCL 5 MG/5ML IJ SOLN
INTRAMUSCULAR | Status: DC | PRN
  Administered 2014-07-29: 1 mg via INTRAVENOUS

## 2014-07-29 MED ORDER — LIDOCAINE HCL (CARDIAC) 20 MG/ML IV SOLN
INTRAVENOUS | Status: DC | PRN
  Administered 2014-07-29: 60 mg via INTRAVENOUS

## 2014-07-29 MED ORDER — OXYCODONE HCL 5 MG PO TABS: 5.0000 mg | ORAL_TABLET | Freq: Once | ORAL | Status: DC | PRN

## 2014-07-29 MED ORDER — FENTANYL CITRATE 0.05 MG/ML IJ SOLN
INTRAMUSCULAR | Status: AC
  Filled 2014-07-29: qty 4

## 2014-07-29 MED ORDER — FENTANYL CITRATE 0.05 MG/ML IJ SOLN
INTRAMUSCULAR | Status: DC | PRN
  Administered 2014-07-29: 50 ug via INTRAVENOUS

## 2014-07-29 MED ORDER — PROPOFOL INFUSION 10 MG/ML OPTIME
INTRAVENOUS | Status: DC | PRN
  Administered 2014-07-29: 75 ug/kg/min via INTRAVENOUS

## 2014-07-29 MED ORDER — LIDOCAINE HCL (PF) 1 % IJ SOLN
INTRAMUSCULAR | Status: AC
  Filled 2014-07-29: qty 30

## 2014-07-29 MED ORDER — HYDROCODONE-ACETAMINOPHEN 5-325 MG PO TABS: 1.0000 | ORAL_TABLET | ORAL | Status: DC | PRN

## 2014-07-29 MED ORDER — BUPIVACAINE HCL (PF) 0.5 % IJ SOLN
INTRAMUSCULAR | Status: AC
  Filled 2014-07-29: qty 30

## 2014-07-29 MED ORDER — LACTATED RINGERS IV SOLN
INTRAVENOUS | Status: DC
  Administered 2014-07-29: 08:00:00 via INTRAVENOUS

## 2014-07-29 MED ORDER — BUPIVACAINE-EPINEPHRINE (PF) 0.5% -1:200000 IJ SOLN
INTRAMUSCULAR | Status: AC
  Filled 2014-07-29: qty 30

## 2014-07-29 MED ORDER — MIDAZOLAM HCL 2 MG/2ML IJ SOLN
INTRAMUSCULAR | Status: AC
  Filled 2014-07-29: qty 2

## 2014-07-29 SURGICAL SUPPLY — 39 items
BANDAGE ELASTIC 4 VELCRO ST LF (GAUZE/BANDAGES/DRESSINGS) ×6 IMPLANT
BENZOIN TINCTURE PRP APPL 2/3 (GAUZE/BANDAGES/DRESSINGS) IMPLANT
BLADE SURG 15 STRL LF DISP TIS (BLADE) ×1 IMPLANT
BLADE SURG 15 STRL SS (BLADE) ×2
CHLORAPREP W/TINT 26ML (MISCELLANEOUS) ×3 IMPLANT
CLEANER CAUTERY TIP 5X5 PAD (MISCELLANEOUS) IMPLANT
CLOSURE WOUND 1/2 X4 (GAUZE/BANDAGES/DRESSINGS) ×1
COVER MAYO STAND STRL (DRAPES) ×3 IMPLANT
COVER TABLE BACK 60X90 (DRAPES) ×3 IMPLANT
DECANTER SPIKE VIAL GLASS SM (MISCELLANEOUS) IMPLANT
DRAPE PED LAPAROTOMY (DRAPES) ×3 IMPLANT
DRAPE UTILITY XL STRL (DRAPES) ×3 IMPLANT
DRSG TEGADERM 4X4.75 (GAUZE/BANDAGES/DRESSINGS) IMPLANT
ELECT REM PT RETURN 9FT ADLT (ELECTROSURGICAL) ×3
ELECTRODE REM PT RTRN 9FT ADLT (ELECTROSURGICAL) ×1 IMPLANT
GLOVE BIOGEL PI IND STRL 7.0 (GLOVE) ×1 IMPLANT
GLOVE BIOGEL PI INDICATOR 7.0 (GLOVE) ×2
GLOVE ECLIPSE 6.5 STRL STRAW (GLOVE) ×3 IMPLANT
GLOVE SURG ORTHO 8.0 STRL STRW (GLOVE) ×3 IMPLANT
GOWN STRL REUS W/ TWL LRG LVL3 (GOWN DISPOSABLE) ×1 IMPLANT
GOWN STRL REUS W/ TWL XL LVL3 (GOWN DISPOSABLE) ×1 IMPLANT
GOWN STRL REUS W/TWL LRG LVL3 (GOWN DISPOSABLE) ×2
GOWN STRL REUS W/TWL XL LVL3 (GOWN DISPOSABLE) ×2
NEEDLE HYPO 25X1 1.5 SAFETY (NEEDLE) ×3 IMPLANT
PACK BASIN DAY SURGERY FS (CUSTOM PROCEDURE TRAY) ×3 IMPLANT
PAD CLEANER CAUTERY TIP 5X5 (MISCELLANEOUS)
PENCIL BUTTON HOLSTER BLD 10FT (ELECTRODE) ×3 IMPLANT
SHEET MEDIUM DRAPE 40X70 STRL (DRAPES) IMPLANT
SPONGE GAUZE 4X4 12PLY STER LF (GAUZE/BANDAGES/DRESSINGS) ×3 IMPLANT
STRIP CLOSURE SKIN 1/2X4 (GAUZE/BANDAGES/DRESSINGS) ×2 IMPLANT
SUT ETHILON 3 0 PS 1 (SUTURE) IMPLANT
SUT MNCRL AB 4-0 PS2 18 (SUTURE) ×3 IMPLANT
SUT VIC AB 3-0 SH 27 (SUTURE) ×2
SUT VIC AB 3-0 SH 27X BRD (SUTURE) ×1 IMPLANT
SUT VICRYL 3-0 CR8 SH (SUTURE) IMPLANT
SUT VICRYL 4-0 PS2 18IN ABS (SUTURE) IMPLANT
SYR CONTROL 10ML LL (SYRINGE) ×3 IMPLANT
TOWEL OR 17X24 6PK STRL BLUE (TOWEL DISPOSABLE) ×6 IMPLANT
TOWEL OR NON WOVEN STRL DISP B (DISPOSABLE) ×3 IMPLANT

## 2014-07-29 NOTE — Anesthesia Preprocedure Evaluation (Addendum)
Anesthesia Evaluation  Patient identified by MRN, date of birth, ID band Patient awake    Reviewed: Allergy & Precautions, H&P , NPO status , Patient's Chart, lab work & pertinent test results  Airway Mallampati: II TM Distance: >3 FB Neck ROM: Full    Dental no notable dental hx. (+) Teeth Intact, Dental Advisory Given   Pulmonary neg pulmonary ROS,  breath sounds clear to auscultation  Pulmonary exam normal       Cardiovascular negative cardio ROS  Rhythm:Regular Rate:Normal     Neuro/Psych negative neurological ROS  negative psych ROS   GI/Hepatic negative GI ROS, Neg liver ROS,   Endo/Other  negative endocrine ROS  Renal/GU negative Renal ROS  negative genitourinary   Musculoskeletal   Abdominal   Peds  Hematology negative hematology ROS (+)   Anesthesia Other Findings   Reproductive/Obstetrics negative OB ROS                          Anesthesia Physical Anesthesia Plan  ASA: I  Anesthesia Plan: MAC   Post-op Pain Management:    Induction: Intravenous  Airway Management Planned: Simple Face Mask  Additional Equipment:   Intra-op Plan:   Post-operative Plan:   Informed Consent: I have reviewed the patients History and Physical, chart, labs and discussed the procedure including the risks, benefits and alternatives for the proposed anesthesia with the patient or authorized representative who has indicated his/her understanding and acceptance.   Dental advisory given  Plan Discussed with: CRNA  Anesthesia Plan Comments:         Anesthesia Quick Evaluation

## 2014-07-29 NOTE — Brief Op Note (Signed)
07/29/2014  9:45 AM  PATIENT:  John Barrett  63 y.o. male  PRE-OPERATIVE DIAGNOSIS:  Soft Tissue Mass Left Lower Leg  POST-OPERATIVE DIAGNOSIS:  same  PROCEDURE:  Procedure(s): EXCISION SOFT TISSUE MASS LEFT LOWER LEG (Left) (3x2x1 cm)  SURGEON:  Surgeon(s) and Role:    * Armandina Gemma, MD - Primary  ANESTHESIA:   IV sedation  EBL:  Total I/O In: 700 [I.V.:700] Out: -   BLOOD ADMINISTERED:none  DRAINS: none   LOCAL MEDICATIONS USED:  MARCAINE     SPECIMEN:  Excision  DISPOSITION OF SPECIMEN:  PATHOLOGY  COUNTS:  YES  TOURNIQUET:  * No tourniquets in log *  DICTATION: .Other Dictation: Dictation Number [id number]  PLAN OF CARE: Discharge to home after PACU  PATIENT DISPOSITION:  PACU - hemodynamically stable.   Delay start of Pharmacological VTE agent (>24hrs) due to surgical blood loss or risk of bleeding: yes  Earnstine Regal, MD, [facility] Office: [phone]

## 2014-07-29 NOTE — Transfer of Care (Signed)
Immediate Anesthesia Transfer of Care Note  Patient: John Barrett  Procedure(s) Performed: Procedure(s): EXCISION SOFT TISSUE MASS LEFT LOWER LEG (Left)  Patient Location: PACU  Anesthesia Type:MAC  Level of Consciousness: awake, alert , oriented and patient cooperative  Airway & Oxygen Therapy: Patient Spontanous Breathing and Patient connected to face mask oxygen  Post-op Assessment: Report given to PACU RN and Post -op Vital signs reviewed and stable  Post vital signs: Reviewed and stable  Complications: No apparent anesthesia complications

## 2014-07-29 NOTE — Anesthesia Postprocedure Evaluation (Signed)
  Anesthesia Post-op Note  Patient: John Barrett  Procedure(s) Performed: Procedure(s): EXCISION SOFT TISSUE MASS LEFT LOWER LEG (Left)  Patient Location: PACU  Anesthesia Type: MAC  Level of Consciousness: awake and alert   Airway and Oxygen Therapy: Patient Spontanous Breathing  Post-op Pain: none  Post-op Assessment: Post-op Vital signs reviewed, Patient's Cardiovascular Status Stable and Respiratory Function Stable  Post-op Vital Signs: Reviewed  Filed Vitals:   07/29/14 1021  BP:   Pulse: 59  Temp:   Resp: 12    Complications: No apparent anesthesia complications

## 2014-07-29 NOTE — Anesthesia Procedure Notes (Signed)
Procedure Name: MAC Date/Time: 07/29/2014 9:00 AM Performed by: Chrisann Melaragno, Graeme Pre-anesthesia Checklist: Patient identified, Emergency Drugs available, Suction available, Patient being monitored and Timeout performed Patient Re-evaluated:Patient Re-evaluated prior to inductionOxygen Delivery Method: Simple face mask

## 2014-07-29 NOTE — Op Note (Signed)
NAMEJAMEIR, John Barrett NO.:  [id number]  MEDICAL RECORD NO.:  [id number]  LOCATION:                                 FACILITY:  PHYSICIAN:  Earnstine Regal, MD           DATE OF BIRTH:  DATE OF PROCEDURE:  07/29/2014                              OPERATIVE REPORT   PREOPERATIVE DIAGNOSIS:  Soft tissue mass, left lower extremity.  POSTOP DIAGNOSIS:  Soft tissue mass, left lower extremity.  PROCEDURE:  Excision soft tissue mass, left lateral leg (3 x 2 x 1 cm).  SURGEON:  Armandina Gemma, MD, FACS  ANESTHESIA:  Local with intravenous sedation.  ESTIMATED BLOOD LOSS:  Minimal.  PREPARATION:  ChloraPrep.  COMPLICATIONS:  None.  INDICATIONS:  The patient is a 63 year old male who presents on referral from his primary care physician, Dr. Unice Cobble, for evaluation of soft tissue mass on the left lateral lower leg.  This has gradually increased in size.  It has been present for less than 1 year.  It does not cause any discomfort.  There was no history of trauma.  The patient now comes to Surgery for excision for definitive diagnosis.  BODY OF REPORT:  Procedure was done in OR #1 at the [facility].  The patient was brought to the operating room, placed in a right lateral decubitus position on the operating room table. Following administration of intravenous sedation, the left lower leg was prepped and draped in the usual aseptic fashion.  After ascertaining that an adequate level of sedation had been achieved, the skin was anesthetized with local anesthetic.  A 3.5 cm incision was made longitudinally over the mass.  Dissection was carried into the subcutaneous tissues.  The mass was identified.  It appears to be cystic.  After opening the cyst, it drains several cc of a clear serous fluid.  The wall of the cyst appears to be quite thin.  Using the electrocautery, the entire cyst wall and surrounding subcutaneous tissue was excised and good  hemostasis was achieved.  The entire specimen was submitted to Pathology for review.  Good hemostasis was noted throughout the wound.  Subcutaneous tissues were closed with interrupted 3-0 Vicryl sutures.  Skin was closed with a running 4-0 Monocryl subcuticular suture.  Wound was washed and dried and benzoin and Steri-Strips were applied.  Sterile dressings were applied.  An Ace wrap was placed on the legs.  The patient was awakened from sedation and brought to the recovery room.  The patient tolerated the procedure well.   Earnstine Regal, MD, [facility] Office: [phone]    TMG/MEDQ  D:  07/29/2014  T:  07/29/2014  Job:  [id number]  cc:   Darrick Penna. Linna Darner, MD,FACP,FCCP

## 2014-07-29 NOTE — Discharge Instructions (Signed)

## 2014-07-29 NOTE — H&P (Signed)
General Surgery [facility]  Berl Bonfanti 07/13/2014 9:13 AM Location: [facility] Patient #: [id number] DOB: Sep 26, 1951 Married / Language: John Barrett / Race: White Male  History of Present Illness Earnstine Regal MD; 07/13/2014 9:36 AM) Patient words: lipoma.  The patient is a 63 year old male who presents with a soft tissue mass. Patient is seen on referral from Dr. Ignacia Palma for soft tissue mass on left lower extremity.  Patient presents with a soft tissue mass on the lateral aspect of the left lower leg. This was not present one year ago. It gradually increased in size and then has stabilized. It does not cause pain. There is no history of trauma. There have been no cutaneous changes. Patient presents today to discuss surgical excision for definitive diagnosis.   Other Problems Jenny Reichmann [place], LPN; 9/50/9326 7:12 AM) Enlarged Prostate  Past Surgical History Jenny Reichmann [place], LPN; 4/58/0998 3:38 AM) Appendectomy Colon Polyp Removal - Colonoscopy Oral Surgery Tonsillectomy Vasectomy  Diagnostic Studies History Joseph Pierini, LPN; 2/50/5397 6:73 AM) Colonoscopy 1-5 years ago  Medication History Marjean Donna, CMA; 07/13/2014 9:21 AM) Alfuzosin HCl ER (10MG  Tablet ER 24HR, Oral daily) Active. Aspirin Low Dose (81MG  Tablet, Oral daily) Active. Multivitamins (Oral daily) Active.  Social History Jenny Reichmann [place], LPN; 02/13/3789 2:40 AM) Alcohol use Moderate alcohol use. Caffeine use Coffee. No drug use Tobacco use Never smoker.  Family History Joseph Pierini, LPN; 9/73/5329 9:24 AM) Colon Polyps Father. Heart Disease Father. Ischemic Bowel Disease Mother. Thyroid problems Father, Mother.  Review of Systems Jenny Reichmann [place] LPN; 2/68/3419 6:22 AM) General Not Present- Appetite Loss, Chills, Fatigue, Fever, Night Sweats, Weight Gain and Weight Loss. Skin Present- New Lesions. Not Present- Change in Wart/Mole, Dryness, Hives,  Jaundice, Non-Healing Wounds, Rash and Ulcer. HEENT Present- Hearing Loss, Seasonal Allergies and Wears glasses/contact lenses. Not Present- Earache, Hoarseness, Nose Bleed, Oral Ulcers, Ringing in the Ears, Sinus Pain, Sore Throat, Visual Disturbances and Yellow Eyes. Respiratory Present- Snoring. Not Present- Bloody sputum, Chronic Cough, Difficulty Breathing and Wheezing. Breast Not Present- Breast Mass, Breast Pain, Nipple Discharge and Skin Changes. Cardiovascular Not Present- Chest Pain, Difficulty Breathing Lying Down, Leg Cramps, Palpitations, Rapid Heart Rate, Shortness of Breath and Swelling of Extremities. Gastrointestinal Not Present- Abdominal Pain, Bloating, Bloody Stool, Change in Bowel Habits, Chronic diarrhea, Constipation, Difficulty Swallowing, Excessive gas, Gets full quickly at meals, Hemorrhoids, Indigestion, Nausea, Rectal Pain and Vomiting. Male Genitourinary Not Present- Blood in Urine, Change in Urinary Stream, Frequency, Impotence, Nocturia, Painful Urination, Urgency and Urine Leakage. Musculoskeletal Not Present- Back Pain, Joint Pain, Joint Stiffness, Muscle Pain, Muscle Weakness and Swelling of Extremities. Neurological Not Present- Decreased Memory, Fainting, Headaches, Numbness, Seizures, Tingling, Tremor, Trouble walking and Weakness. Psychiatric Not Present- Anxiety, Bipolar, Change in Sleep Pattern, Depression, Fearful and Frequent crying. Endocrine Not Present- Cold Intolerance, Excessive Hunger, Hair Changes, Heat Intolerance, Hot flashes and New Diabetes. Hematology Not Present- Easy Bruising, Excessive bleeding, Gland problems, HIV and Persistent Infections.   Vitals (Sonya Bynum CMA; 07/13/2014 9:22 AM) 07/13/2014 9:22 AM Weight: 191 lb Height: 74in Body Surface Area: 2.13 m Body Mass Index: 24.52 kg/m Temp.: 97.62F(Temporal)  Pulse: 81 (Regular)  BP: 118/80 (Sitting, Left Arm, Standard)    Physical Exam Earnstine Regal MD; 07/13/2014 9:39  AM) General Mental Status-Alert. General Appearance-Consistent with stated age. Hydration-Well hydrated. Voice-Normal.  Head and Neck Head-normocephalic, atraumatic with no lesions or palpable masses. Trachea-midline. Thyroid Gland Characteristics - normal size and consistency.  Eye Eyeball - Bilateral-Extraocular  movements intact. Sclera/Conjunctiva - Bilateral-No scleral icterus.  Chest and Lung Exam Chest and lung exam reveals -quiet, even and easy respiratory effort with no use of accessory muscles, normal resonance, no flatness or dullness, non-tender and normal tactile fremitus and on auscultation, normal breath sounds, no adventitious sounds and normal vocal resonance. Inspection Chest Wall - Normal. Back - normal.  Cardiovascular Cardiovascular examination reveals -on palpation PMI is normal in location and amplitude, no palpable S3 or S4. Normal cardiac borders., normal heart sounds, regular rate and rhythm with no murmurs, carotid auscultation reveals no bruits and normal pedal pulses bilaterally.  Neurologic Neurologic evaluation reveals -alert and oriented x 3 with no impairment of recent or remote memory. Mental Status-Normal.  Musculoskeletal Normal Exam - Left-Upper Extremity Strength Normal and Lower Extremity Strength Normal. Normal Exam - Right-Upper Extremity Strength Normal, Lower Extremity Weakness. Lower Extremity -Note: On the lateral aspect of the left lower leg is a soft tissue mass measuring 3 cm x 2.5 cm x 1 cm in size. It is discrete, mobile, and nontender. There may be a venous branch overlying the mass. The mass does not appear to be fixed to the underlying musculature. There are no cutaneous changes.     Assessment & Plan Earnstine Regal MD; 07/13/2014 9:41 AM) SOFT TISSUE NEOPLASM (239.2  D49.2) Impression: The patient has a soft tissue mass overlying the left lateral leg. This measures approximately 3 cm in  greatest dimension. On examination this likely represents a lipoma or an angio-lipoma, although soft tissue neoplasm cannot be ruled out. Patient would like to proceed with excision for definitive diagnosis. We discussed the procedure. This will be scheduled as an outpatient under sedation. Further plans will be based on pathologic results.  The risks and benefits of the procedure have been discussed at length with the patient. The patient understands the proposed procedure, potential alternative treatments, and the course of recovery to be expected. All of the patient's questions have been answered at this time. The patient wishes to proceed with surgery.     Signed by Earnstine Regal, MD (07/13/2014 9:44 AM)

## 2014-08-01 ENCOUNTER — Encounter (HOSPITAL_BASED_OUTPATIENT_CLINIC_OR_DEPARTMENT_OTHER): Payer: Self-pay | Admitting: Surgery

## 2014-08-02 NOTE — Progress Notes (Signed)
Quick Note:  Please contact patient and notify of benign pathology results.  Job Holtsclaw M. Alishea Beaudin, MD, FACS [facility] Office: [phone]   ______ 

## 2014-11-14 ENCOUNTER — Telehealth: Payer: Self-pay | Admitting: Gastroenterology

## 2014-11-15 NOTE — Telephone Encounter (Signed)
Dr. Fuller Plan  this is a former patient of Dr. Sharlett Iles that wants to switch to you.  He has had a procedure that Dr. Deatra Ina performed for Dr. Sharlett Iles in the hospital in 2010. Is it ok to switch to you? He is due for a recall colon according to the recall letter that was mailed to the patient

## 2014-11-15 NOTE — Telephone Encounter (Signed)
Yes. OK to switch to me.

## 2014-11-30 ENCOUNTER — Encounter: Payer: Self-pay | Admitting: Gastroenterology

## 2014-12-27 HISTORY — PX: COLONOSCOPY: SHX174

## 2015-01-12 ENCOUNTER — Ambulatory Visit (AMBULATORY_SURGERY_CENTER): Payer: Self-pay | Admitting: *Deleted

## 2015-01-12 VITALS — Ht 73.0 in | Wt 186.8 lb

## 2015-01-12 DIAGNOSIS — Z86010 Personal history of colonic polyps: Secondary | ICD-10-CM

## 2015-01-12 MED ORDER — MOVIPREP 100 G PO SOLR: 1.0000 | Freq: Once | ORAL | Status: DC

## 2015-01-12 NOTE — Progress Notes (Signed)
No egg or soy allergy No diet pills No home 02 use No issues with past sedation emmi video declined

## 2015-01-19 ENCOUNTER — Encounter: Payer: Self-pay | Admitting: Gastroenterology

## 2015-01-19 ENCOUNTER — Ambulatory Visit (AMBULATORY_SURGERY_CENTER): Payer: 59 | Admitting: Gastroenterology

## 2015-01-19 VITALS — BP 108/73 | HR 55 | Temp 97.1°F | Resp 14 | Ht 73.0 in | Wt 186.0 lb

## 2015-01-19 DIAGNOSIS — D122 Benign neoplasm of ascending colon: Secondary | ICD-10-CM

## 2015-01-19 DIAGNOSIS — Z86010 Personal history of colonic polyps: Secondary | ICD-10-CM

## 2015-01-19 DIAGNOSIS — D120 Benign neoplasm of cecum: Secondary | ICD-10-CM

## 2015-01-19 MED ORDER — SODIUM CHLORIDE 0.9 % IV SOLN: 500.0000 mL | INTRAVENOUS | Status: DC

## 2015-01-19 NOTE — Patient Instructions (Signed)
Discharge instructions given. Handouts on polyps and diverticulosis. Resume previous medications. YOU HAD AN ENDOSCOPIC PROCEDURE TODAY AT THE [facility]:   Refer to the procedure report that was given to you for any specific questions about what was found during the examination.  If the procedure report does not answer your questions, please call your gastroenterologist to clarify.  If you requested that your care partner not be given the details of your procedure findings, then the procedure report has been included in a sealed envelope for you to review at your convenience later.  YOU SHOULD EXPECT: Some feelings of bloating in the abdomen. Passage of more gas than usual.  Walking can help get rid of the air that was put into your GI tract during the procedure and reduce the bloating. If you had a lower endoscopy (such as a colonoscopy or flexible sigmoidoscopy) you may notice spotting of blood in your stool or on the toilet paper. If you underwent a bowel prep for your procedure, you may not have a normal bowel movement for a few days.  Please Note:  You might notice some irritation and congestion in your nose or some drainage.  This is from the oxygen used during your procedure.  There is no need for concern and it should clear up in a day or so.  SYMPTOMS TO REPORT IMMEDIATELY:   Following lower endoscopy (colonoscopy or flexible sigmoidoscopy):  Excessive amounts of blood in the stool  Significant tenderness or worsening of abdominal pains  Swelling of the abdomen that is new, acute  Fever of 100F or higher   For urgent or emergent issues, a gastroenterologist can be reached at any hour by calling [phone].   DIET: Your first meal following the procedure should be a small meal and then it is ok to progress to your normal diet. Heavy or fried foods are harder to digest and may make you feel nauseous or bloated.  Likewise, meals heavy in dairy and vegetables can  increase bloating.  Drink plenty of fluids but you should avoid alcoholic beverages for 24 hours.  ACTIVITY:  You should plan to take it easy for the rest of today and you should NOT DRIVE or use heavy machinery until tomorrow (because of the sedation medicines used during the test).    FOLLOW UP: Our staff will call the number listed on your records the next business day following your procedure to check on you and address any questions or concerns that you may have regarding the information given to you following your procedure. If we do not reach you, we will leave a message.  However, if you are feeling well and you are not experiencing any problems, there is no need to return our call.  We will assume that you have returned to your regular daily activities without incident.  If any biopsies were taken you will be contacted by phone or by letter within the next 1-3 weeks.  Please call us at [phone] if you have not heard about the biopsies in 3 weeks.    SIGNATURES/CONFIDENTIALITY: You and/or your care partner have signed paperwork which will be entered into your electronic medical record.  These signatures attest to the fact that that the information above on your After Visit Summary has been reviewed and is understood.  Full responsibility of the confidentiality of this discharge information lies with you and/or your care-partner. 

## 2015-01-19 NOTE — Progress Notes (Signed)
To recovery, report to McCoy, RN, VSS 

## 2015-01-19 NOTE — Op Note (Signed)
[place]  [address] [place], [id number]   COLONOSCOPY PROCEDURE REPORT  PATIENT: John Barrett, John Barrett  MR#: [id number] BIRTHDATE: December 30, 1950 , 64  yrs. old GENDER: male ENDOSCOPIST: Ladene Artist, MD, [facility] PROCEDURE DATE:  01/19/2015 PROCEDURE:   Colonoscopy, surveillance and Colonoscopy with snare polypectomy First Screening Colonoscopy - Avg.  risk and is 50 yrs.  old or older - No.  Prior Negative Screening - Now for repeat screening. N/A  History of Adenoma - Now for follow-up colonoscopy & has been > or = to 3 yrs.  Yes hx of adenoma.  Has been 3 or more years since last colonoscopy. ASA CLASS:   Class II INDICATIONS:Surveillance due to prior colonic neoplasia and PH Colon Adenoma. MEDICATIONS: Monitored anesthesia care and Propofol 240 mg IV DESCRIPTION OF PROCEDURE:   After the risks benefits and alternatives of the procedure were thoroughly explained, informed consent was obtained.  The digital rectal exam revealed no abnormalities of the rectum.   The [id number]  endoscope was introduced through the anus and advanced to the cecum, which was identified by both the appendix and ileocecal valve. No adverse events experienced.   The quality of the prep was excellent. (MoviPrep was used)  The instrument was then slowly withdrawn as the colon was fully examined.    COLON FINDINGS: A sessile polyp measuring 5 mm in size was found at the cecum.  A polypectomy was performed with a cold snare.  The resection was complete, the polyp tissue was completely retrieved and sent to histology.   Two sessile polyps measuring 5-7 mm in size were found in the ascending colon.  Polypectomies were performed with a cold snare.  The resection was complete, the polyp tissue was completely retrieved and sent to histology.   There was mild diverticulosis noted in the transverse colon, ascending colon, and at the cecum.   The examination was otherwise  normal. Retroflexed views revealed no abnormalities. The time to cecum = 1.9 Withdrawal time = 16.5   The scope was withdrawn and the procedure completed. COMPLICATIONS: There were no immediate complications.  ENDOSCOPIC IMPRESSION: 1.   Sessile polyp at the cecum; polypectomy performed with a cold snare 2.   Two sessile polyps in the ascending colon; polypectomies performed with a cold snare 3.   Mild diverticulosis in the transverse colon, ascending colon, and at the cecum 4.   The examination was otherwise normal  RECOMMENDATIONS: 1.  Await pathology results 2.  High fiber diet with liberal fluid intake. 3.  Repeat Colonoscopy in 5 years.  eSigned:  Ladene Artist, MD, [facility] 01/19/2015 8:29 AM    PATIENT NAME:  John Barrett, John Barrett MR#: [id number]

## 2015-01-19 NOTE — Progress Notes (Signed)
Called to room to assist during endoscopic procedure.  Patient ID and intended procedure confirmed with present staff. Received instructions for my participation in the procedure from the performing physician.  

## 2015-01-23 ENCOUNTER — Telehealth: Payer: Self-pay | Admitting: *Deleted

## 2015-01-23 NOTE — Telephone Encounter (Signed)
  Follow up Call-  Call back number 01/19/2015  Post procedure Call Back phone  # [phone]  Permission to leave phone message Yes     Patient questions:  Do you have a fever, pain , or abdominal swelling? No. Pain Score  0 *  Have you tolerated food without any problems? Yes.    Have you been able to return to your normal activities? Yes.    Do you have any questions about your discharge instructions: Diet   No. Medications  No. Follow up visit  No.  Do you have questions or concerns about your Care? No.  Actions: * If pain score is 4 or above: No action needed, pain <4.

## 2015-01-26 ENCOUNTER — Encounter: Payer: Self-pay | Admitting: Gastroenterology

## 2015-02-14 ENCOUNTER — Encounter: Payer: 59 | Admitting: Gastroenterology

## 2015-11-06 DIAGNOSIS — D2112 Benign neoplasm of connective and other soft tissue of left upper limb, including shoulder: Secondary | ICD-10-CM | POA: Diagnosis not present

## 2015-11-06 DIAGNOSIS — M720 Palmar fascial fibromatosis [Dupuytren]: Secondary | ICD-10-CM | POA: Diagnosis not present

## 2015-11-07 ENCOUNTER — Other Ambulatory Visit: Payer: Self-pay | Admitting: Dermatology

## 2015-11-07 DIAGNOSIS — L570 Actinic keratosis: Secondary | ICD-10-CM | POA: Diagnosis not present

## 2015-11-07 DIAGNOSIS — D485 Neoplasm of uncertain behavior of skin: Secondary | ICD-10-CM | POA: Diagnosis not present

## 2015-11-07 MED FILL — FLUOCINONIDE 0.05% CREAM: 0.05 | 30 days supply | Qty: 60 | Fill #0

## 2015-11-17 ENCOUNTER — Other Ambulatory Visit (HOSPITAL_COMMUNITY): Payer: Self-pay | Admitting: Orthopedic Surgery

## 2015-11-17 DIAGNOSIS — M720 Palmar fascial fibromatosis [Dupuytren]: Secondary | ICD-10-CM

## 2015-11-17 DIAGNOSIS — D2112 Benign neoplasm of connective and other soft tissue of left upper limb, including shoulder: Secondary | ICD-10-CM

## 2015-11-21 ENCOUNTER — Ambulatory Visit (HOSPITAL_COMMUNITY)
Admission: RE | Admit: 2015-11-21 | Discharge: 2015-11-21 | Disposition: A | Discharge status: Home / Self Care | Payer: 59 | Source: Ambulatory Visit | Attending: Orthopedic Surgery | Admitting: Orthopedic Surgery

## 2015-11-21 DIAGNOSIS — M720 Palmar fascial fibromatosis [Dupuytren]: Secondary | ICD-10-CM | POA: Diagnosis not present

## 2015-11-21 DIAGNOSIS — D2112 Benign neoplasm of connective and other soft tissue of left upper limb, including shoulder: Secondary | ICD-10-CM | POA: Diagnosis not present

## 2015-11-21 DIAGNOSIS — D367 Benign neoplasm of other specified sites: Secondary | ICD-10-CM | POA: Diagnosis not present

## 2015-11-22 DIAGNOSIS — M720 Palmar fascial fibromatosis [Dupuytren]: Secondary | ICD-10-CM | POA: Diagnosis not present

## 2015-11-22 DIAGNOSIS — D2112 Benign neoplasm of connective and other soft tissue of left upper limb, including shoulder: Secondary | ICD-10-CM | POA: Diagnosis not present

## 2015-12-05 MED FILL — FLUOCINONIDE 0.05% CREAM: 0.05 | 30 days supply | Qty: 60 | Fill #1

## 2015-12-14 MED FILL — BETAMETHASONE VALER 0.12% F: 0.12 | 30 days supply | Qty: 100 | Fill #2

## 2016-01-12 MED FILL — FLUOCINONIDE 0.05% CREAM: 0.05 | 30 days supply | Qty: 60 | Fill #2

## 2016-02-14 MED FILL — FLUOCINONIDE 0.05% CREAM: 0.05 | 30 days supply | Qty: 60 | Fill #3

## 2016-03-07 DIAGNOSIS — L089 Local infection of the skin and subcutaneous tissue, unspecified: Secondary | ICD-10-CM | POA: Diagnosis not present

## 2016-03-07 DIAGNOSIS — L0291 Cutaneous abscess, unspecified: Secondary | ICD-10-CM | POA: Diagnosis not present

## 2016-04-05 MED FILL — FLUOCINONIDE 0.05% CREAM: 0.05 | 30 days supply | Qty: 60 | Fill #4

## 2016-04-23 ENCOUNTER — Other Ambulatory Visit: Payer: Self-pay | Admitting: Dermatology

## 2016-04-23 DIAGNOSIS — D485 Neoplasm of uncertain behavior of skin: Secondary | ICD-10-CM | POA: Diagnosis not present

## 2016-04-23 DIAGNOSIS — L418 Other parapsoriasis: Secondary | ICD-10-CM | POA: Diagnosis not present

## 2016-04-23 DIAGNOSIS — L720 Epidermal cyst: Secondary | ICD-10-CM | POA: Diagnosis not present

## 2016-04-23 DIAGNOSIS — L820 Inflamed seborrheic keratosis: Secondary | ICD-10-CM | POA: Diagnosis not present

## 2016-04-26 MED FILL — ENSTILAR 0.005%-0.064% FOAM: 0.005-0.064 | 30 days supply | Qty: 60 | Fill #0

## 2016-05-31 MED FILL — ENSTILAR 0.005%-0.064% FOAM: 0.005-0.064 | 30 days supply | Qty: 60 | Fill #1

## 2016-08-12 MED FILL — ENSTILAR 0.005%-0.064% FOAM: 0.005-0.064 | 30 days supply | Qty: 60 | Fill #0

## 2016-09-18 MED FILL — ENSTILAR 0.005%-0.064% FOAM: 0.005-0.064 | 30 days supply | Qty: 60 | Fill #1

## 2016-10-22 MED FILL — ENSTILAR 0.005%-0.064% FOAM: 0.005-0.064 | 30 days supply | Qty: 60 | Fill #2

## 2016-11-12 ENCOUNTER — Other Ambulatory Visit: Payer: Self-pay | Admitting: Dermatology

## 2016-11-12 DIAGNOSIS — D492 Neoplasm of unspecified behavior of bone, soft tissue, and skin: Secondary | ICD-10-CM | POA: Diagnosis not present

## 2016-11-12 DIAGNOSIS — L409 Psoriasis, unspecified: Secondary | ICD-10-CM | POA: Diagnosis not present

## 2016-11-12 DIAGNOSIS — L570 Actinic keratosis: Secondary | ICD-10-CM | POA: Diagnosis not present

## 2016-11-14 MED FILL — ACITRETIN 25 MG CAPSULE: 25 | 30 days supply | Qty: 30 | Fill #0

## 2016-11-21 DIAGNOSIS — Z0100 Encounter for examination of eyes and vision without abnormal findings: Secondary | ICD-10-CM | POA: Diagnosis not present

## 2016-12-13 MED FILL — ACITRETIN 25 MG CAP: 25 | 30 days supply | Qty: 30 | Fill #1

## 2016-12-23 DIAGNOSIS — L409 Psoriasis, unspecified: Secondary | ICD-10-CM | POA: Diagnosis not present

## 2016-12-23 DIAGNOSIS — L111 Transient acantholytic dermatosis [Grover]: Secondary | ICD-10-CM | POA: Diagnosis not present

## 2016-12-23 DIAGNOSIS — Z79899 Other long term (current) drug therapy: Secondary | ICD-10-CM | POA: Diagnosis not present

## 2016-12-26 MED FILL — TRIAMCINOLONE 0.1% CREAM: 0.1 | 10 days supply | Qty: 80 | Fill #0

## 2017-02-18 MED FILL — TRIAMCINOLONE 0.1% CREAM: 0.1 | 10 days supply | Qty: 80 | Fill #1

## 2017-03-18 DIAGNOSIS — S0501XA Injury of conjunctiva and corneal abrasion without foreign body, right eye, initial encounter: Secondary | ICD-10-CM | POA: Diagnosis not present

## 2017-06-04 ENCOUNTER — Encounter: Payer: Self-pay | Admitting: Family Medicine

## 2017-06-04 ENCOUNTER — Ambulatory Visit (INDEPENDENT_AMBULATORY_CARE_PROVIDER_SITE_OTHER): Payer: 59 | Admitting: Family Medicine

## 2017-06-04 VITALS — BP 120/82 | HR 67 | Temp 98.0°F | Ht 73.0 in | Wt 182.0 lb

## 2017-06-04 DIAGNOSIS — Z125 Encounter for screening for malignant neoplasm of prostate: Secondary | ICD-10-CM | POA: Diagnosis not present

## 2017-06-04 DIAGNOSIS — Z86010 Personal history of colonic polyps: Secondary | ICD-10-CM | POA: Diagnosis not present

## 2017-06-04 DIAGNOSIS — Z79899 Other long term (current) drug therapy: Secondary | ICD-10-CM | POA: Diagnosis not present

## 2017-06-04 DIAGNOSIS — Z13220 Encounter for screening for lipoid disorders: Secondary | ICD-10-CM | POA: Diagnosis not present

## 2017-06-04 DIAGNOSIS — L719 Rosacea, unspecified: Secondary | ICD-10-CM | POA: Diagnosis not present

## 2017-06-04 DIAGNOSIS — T7840XA Allergy, unspecified, initial encounter: Secondary | ICD-10-CM | POA: Insufficient documentation

## 2017-06-04 DIAGNOSIS — L111 Transient acantholytic dermatosis [Grover]: Secondary | ICD-10-CM | POA: Insufficient documentation

## 2017-06-04 DIAGNOSIS — M25562 Pain in left knee: Secondary | ICD-10-CM

## 2017-06-04 DIAGNOSIS — Z23 Encounter for immunization: Secondary | ICD-10-CM | POA: Diagnosis not present

## 2017-06-04 DIAGNOSIS — Z85828 Personal history of other malignant neoplasm of skin: Secondary | ICD-10-CM | POA: Insufficient documentation

## 2017-06-04 DIAGNOSIS — N401 Enlarged prostate with lower urinary tract symptoms: Secondary | ICD-10-CM | POA: Insufficient documentation

## 2017-06-04 DIAGNOSIS — Z0000 Encounter for general adult medical examination without abnormal findings: Secondary | ICD-10-CM | POA: Diagnosis not present

## 2017-06-04 DIAGNOSIS — G2581 Restless legs syndrome: Secondary | ICD-10-CM | POA: Diagnosis not present

## 2017-06-04 DIAGNOSIS — Z1159 Encounter for screening for other viral diseases: Secondary | ICD-10-CM

## 2017-06-04 DIAGNOSIS — R351 Nocturia: Secondary | ICD-10-CM

## 2017-06-04 LAB — LIPID PANEL
Cholesterol: 178 mg/dL (ref 0–200)
HDL: 64.4 mg/dL (ref >39.00)
LDL Cholesterol: 103 mg/dL — ABNORMAL HIGH (ref 0–99)
NONHDL: 113.86
TRIGLYCERIDES: 53 mg/dL (ref 0.0–149.0)
Total CHOL/HDL Ratio: 3
VLDL: 10.6 mg/dL (ref 0.0–40.0)

## 2017-06-04 LAB — COMPREHENSIVE METABOLIC PANEL
ALK PHOS: 64 U/L (ref 39–117)
ALT: 18 U/L (ref 0–53)
AST: 22 U/L (ref 0–37)
Albumin: 4.2 g/dL (ref 3.5–5.2)
BUN: 16 mg/dL (ref 6–23)
CALCIUM: 8.8 mg/dL (ref 8.4–10.5)
CO2: 34 mEq/L — ABNORMAL HIGH (ref 19–32)
Chloride: 104 mEq/L (ref 96–112)
Creatinine, Ser: 0.84 mg/dL (ref 0.40–1.50)
GFR: 97.01 mL/min (ref >60.00)
GLUCOSE: 102 mg/dL — AB (ref 70–99)
POTASSIUM: 4.2 meq/L (ref 3.5–5.1)
Sodium: 140 mEq/L (ref 135–145)
TOTAL PROTEIN: 5.9 g/dL — AB (ref 6.0–8.3)
Total Bilirubin: 1.3 mg/dL — ABNORMAL HIGH (ref 0.2–1.2)

## 2017-06-04 LAB — PSA: PSA: 5.73 ng/mL — ABNORMAL HIGH (ref 0.10–4.00)

## 2017-06-04 LAB — CBC
HEMATOCRIT: 49.5 % (ref 39.0–52.0)
HEMOGLOBIN: 16.2 g/dL (ref 13.0–17.0)
MCHC: 32.6 g/dL (ref 30.0–36.0)
MCV: 92.2 fl (ref 78.0–100.0)
Platelets: 218 10*3/uL (ref 150.0–400.0)
RBC: 5.37 Mil/uL (ref 4.22–5.81)
RDW: 14.2 % (ref 11.5–15.5)
WBC: 5.3 10*3/uL (ref 4.0–10.5)

## 2017-06-04 NOTE — Progress Notes (Addendum)
Phone: [phone]  Subjective:  Patient presents today to establish care.  Prior patient of Dr. Linna Darner but last seen 2011. Chief complaint-noted.   See problem oriented charting  The following were reviewed and entered/updated in epic: Past Medical History:  Diagnosis Date  . Allergy    allergy eye drops prn  . Cellulitis    hospitalized 1995  . Chicken pox   . 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   . History of adenomatous polyp of colon    q5 years  . History of skin cancer    basal cell in past. Dr. Denna Haggard  . Psoriasis    has had to use orals in the past. Mainly controlled with triamcinolone per Dr. Denna Haggard  . Rosacea    hydrocortisone OTC per Dr. Denna Haggard  . Shingles    x2  . Soft tissue mass    left lower leg. removed by Dr. Harlow Asa   Patient Active Problem List   Diagnosis Date Noted  . History of skin cancer     Priority: Medium  . History of adenomatous polyp of colon     Priority: Medium  . RESTLESS LEGS SYNDROME 07/27/2009    Priority: Medium  . Allergy     Priority: Low  . Rosacea     Priority: Low  . Grover's disease     Priority: Low  . Left knee pain 03/02/2013    Priority: Low   Past Surgical History:  Procedure Laterality Date  . APPENDECTOMY    . COLONOSCOPY    . HEMORRHOID BANDING  2008  . MASS EXCISION Left 07/29/2014   Procedure: EXCISION SOFT TISSUE MASS LEFT LOWER LEG;  Surgeon: Armandina Gemma, MD;  Location: [facility];  Service: General;  Laterality: Left;  . POLYPECTOMY    . TONSILLECTOMY AND ADENOIDECTOMY    . VEIN LIGATION     of penis. cialis in past for ED related to vein ligation/leak and BPH    Family History  Problem Relation Age of Onset  . Colon polyps Father   . Atrial fibrillation Father        coumadin. 91 and doing well  . Heart murmur Father        unsure which valve but has valve issue  . Other Mother        fall related at age 97 years old.  2010  . Other Maternal  Grandmother        early 13s- "old age"  . Other Maternal Grandfather        unclear cause  . Lung cancer Paternal Grandmother        smoker  . Other Paternal Grandfather        unclear cause  . Sudden death Neg Hx   . Hypertension Neg Hx   . Hyperlipidemia Neg Hx   . Heart attack Neg Hx   . Diabetes Neg Hx   . Colon cancer Neg Hx   . Rectal cancer Neg Hx   . Stomach cancer Neg Hx     Medications- reviewed and updated Current Outpatient Prescriptions  Medication Sig Dispense Refill  . aspirin 81 MG tablet Take 81 mg by mouth daily.    . hydrocortisone cream 1 % Apply 1 application topically daily.    Javier Docker Oil 350 MG CAPS Take 1 capsule by mouth daily.    . Multiple Vitamin (MULTIVITAMIN) capsule Take 1 capsule by mouth daily.    Marland Kitchen  triamcinolone cream (KENALOG) 0.1 % Apply 1 application topically daily. Psorasis     No current facility-administered medications for this visit.     Allergies-reviewed and updated No Known Allergies  Social History   Social History  . Marital status: Married    Spouse name: N/A  . Number of children: N/A  . Years of education: N/A   Social History Main Topics  . Smoking status: Never Smoker  . Smokeless tobacco: Never Used  . Alcohol use 2.4 - 3.0 oz/week    4 - 5 Standard drinks or equivalent per week  . Drug use: No  . Sexual activity: Not Asked   Other Topics Concern  . None   Social History Narrative   Married 12 years in 2018. 2 grown children and 2 grandkids (54 and 103 year old sons in 2018- live in [place]- moving to [place] soon). One son lives [place].    Mother in law moved in with them- Parkinsons 106 years old- Dr. Carles Collet helping      [place] at [employer]- psychology. Masters- at [employer]. [place] [employer]      Hobbies: workout- yoga classes at [employer] bike. Light weight workout. Fair amount of travel- enjoys this.     ROS--Full ROS was completed Review of Systems  Constitutional:  Negative for chills and fever.  HENT: Negative for hearing loss and tinnitus.   Eyes: Negative for blurred vision and double vision.  Respiratory: Negative for cough and hemoptysis.   Cardiovascular: Negative for chest pain and palpitations.  Gastrointestinal: Negative for heartburn and nausea.  Genitourinary: Negative for flank pain and hematuria.  Musculoskeletal: Negative for myalgias and neck pain.  Skin: Negative for itching and rash.  Neurological: Negative for dizziness and headaches.  Endo/Heme/Allergies: Negative for polydipsia. Does not bruise/bleed easily.  Psychiatric/Behavioral: Negative for hallucinations and substance abuse.   Objective: BP 120/82 (BP Location: Left Arm, Patient Position: Sitting, Cuff Size: Large)   Pulse 67   Temp 98 F (36.7 C) (Oral)   Ht 6\' 1"  (1.854 m)   Wt 182 lb (82.6 kg)   SpO2 95%   BMI 24.01 kg/m  Gen: NAD, resting comfortably HEENT: Mucous membranes are moist. Oropharynx normal. TM normal. Eyes: sclera and lids normal, PERRLA Neck: no thyromegaly, no cervical lymphadenopathy CV: RRR no murmurs rubs or gallops Lungs: CTAB no crackles, wheeze, rhonchi Abdomen: soft/nontender/nondistended/normal bowel sounds. No rebound or guarding.  Ext: no edema Skin: warm, dry Neuro: 5/5 strength in upper and lower extremities, normal gait, normal reflexes Rectal: normal tone, diffusely enlarged prostate, no masses or tenderness   Assessment/Plan:  66 y.o. male presenting for annual physical.  Health Maintenance counseling: 1. Anticipatory guidance: Patient counseled regarding regular dental exams q6 months, eye exams - yearly, wearing seatbelts.  2. Risk factor reduction:  Advised patient of need for regular exercise and diet rich and fruits and vegetables to reduce risk of heart attack and stroke. Exercise- excellent. Diet-healthy weight and diet.  Wt Readings from Last 3 Encounters:  06/04/17 182 lb (82.6 kg)  01/19/15 186 lb (84.4 kg)    01/12/15 186 lb 12.8 oz (84.7 kg)  3. Immunizations/screenings/ancillary studies- prevnar 13 today. HCV screen today. Will get flu shot in the fall. Shingles vaccine next year likely.  Immunization History  Administered Date(s) Administered  . Influenza Whole 06/28/2010  . Tdap 02/21/2012  4. Prostate cancer screening- see BPH section 5. Colon cancer screening - 01/19/15 with 5 year repeat recommended due  to history adenoma 6. Skin cancer screening- follows with dermatology q6 months  Status of chronic or acute concerns   Chronic skin issues followed by Dr. Denna Haggard with dermatology including grovers disease, rosacea, history skin cancer  Allergies- rare issues- uses eye lubricants and allergy eye drops  BPH associated with nocturia Last psa 2014- elevated and BPH seeing Dr. Amalia Hailey - has not seen urology since that time. PSA 10/05/13 was 8.39 up from 4.98 on 03/03/13 and 6.77 on 10/13/12. He would want to see Dr. Alinda Money if needed- discussed likely refer him if elevated.   1 year CPE  Orders Placed This Encounter  Procedures  . Pneumococcal conjugate vaccine 13-valent IM  . CBC    [place]  . Comprehensive metabolic panel    [place]    Order Specific Question:   Has the patient fasted?    Answer:   No  . Lipid panel    [place]    Order Specific Question:   Has the patient fasted?    Answer:   No  . PSA  . Hepatitis C antibody    Meds ordered this encounter  Medications  . Krill Oil 350 MG CAPS    Sig: Take 1 capsule by mouth daily.  Marland Kitchen triamcinolone cream (KENALOG) 0.1 %    Sig: Apply 1 application topically daily. Psorasis  . hydrocortisone cream 1 %    Sig: Apply 1 application topically daily.    Return precautions advised.   Garret Reddish, MD

## 2017-06-04 NOTE — Patient Instructions (Addendum)
Prevnar 13 today. Will do final pneumonia shot in 1 year (pneumovax 23) and also likely shingles vaccine at that time  Health Maintenance Due  Topic Date Due  . Hepatitis C Screening - today 1950/12/25  . PNA vac Low Risk Adult (1 of 2 - PCV13)- today 11/17/2015  . INFLUENZA VACCINE - if you could send me a mychart message in the fall when you get this - that would be great 05/28/2017    Please stop by lab before you go

## 2017-06-04 NOTE — Assessment & Plan Note (Signed)
Last psa 2014- elevated and BPH seeing Dr. Amalia Hailey - has not seen urology since that time. PSA 10/05/13 was 8.39 up from 4.98 on 03/03/13 and 6.77 on 10/13/12. He would want to see Dr. Alinda Money if needed- discussed likely refer him if elevated.

## 2017-06-05 LAB — HEPATITIS C ANTIBODY: HCV Ab: NONREACTIVE

## 2017-06-06 ENCOUNTER — Encounter: Payer: Self-pay | Admitting: Family Medicine

## 2017-06-26 ENCOUNTER — Encounter: Payer: Self-pay | Admitting: Podiatry

## 2017-06-26 ENCOUNTER — Telehealth: Payer: Self-pay | Admitting: *Deleted

## 2017-06-26 ENCOUNTER — Ambulatory Visit (INDEPENDENT_AMBULATORY_CARE_PROVIDER_SITE_OTHER): Payer: 59 | Admitting: Podiatry

## 2017-06-26 DIAGNOSIS — L603 Nail dystrophy: Secondary | ICD-10-CM

## 2017-06-26 DIAGNOSIS — Z79899 Other long term (current) drug therapy: Secondary | ICD-10-CM

## 2017-06-26 DIAGNOSIS — B351 Tinea unguium: Secondary | ICD-10-CM | POA: Diagnosis not present

## 2017-06-26 DIAGNOSIS — L601 Onycholysis: Secondary | ICD-10-CM | POA: Diagnosis not present

## 2017-06-26 NOTE — Progress Notes (Addendum)
   Subjective:    Patient ID: John Barrett, male    DOB: Aug 06, 1951, 66 y.o.   MRN: [id number]  HPI  66 year old male presents the office they for concerns of toenail discoloration to his left big toe as well as the second toe. He previously saw a dermatologist and thought that this may have been due to psoriasis. He states that psoriasis is cleared up although he continues to have the nail discoloration. Denies any pain in the nail denies any swelling redness or drainage. His and no recent treatment for this. No other concerns.  He has a history of Grover's disease, and psoriasis  Review of Systems  Eyes: Positive for itching.  Skin:       Change in nails and psoriasis grover's disease and rosacea  All other systems reviewed and are negative.      Objective:   Physical Exam  General: AAO x3, NAD  Dermatological: Left hallux toenail in the second digit toenail. He dystrophic, discolored filled round discoloration. The nail somewhat loose with underlying nailbed distally to the hallux but from a year proximally. There is no pain is no swelling redness or drainage. Is no clinical signs of infection, bacterial today. His new lesions identified.  Vascular: Dorsalis Pedis artery and Posterior Tibial artery pedal pulses are 2/4 bilateral with immedate capillary fill time. There is no pain with calf compression, swelling, warmth, erythema.   Neruologic: Grossly intact via light touch bilateral.  Protective threshold with Semmes Wienstein monofilament intact to all pedal sites bilateral.   Musculoskeletal: No gross boney pedal deformities bilateral. No pain, crepitus, or limitation noted with foot and ankle range of motion bilateral. Muscular strength 5/5 in all groups tested bilateral.  Gait: Unassisted, Nonantalgic.      Assessment & Plan:  Onychodystrophy, possible onychomycosis vs underlying dermatological issue -Treatment options discussed including all alternatives, risks, and  complications -Etiology of symptoms were discussed -Debrided the nails there were symptomatic today medicine this for cultures/pathology to [facility] labs. Discussion options that we'll await the results of the biopsy before proceeding with definitive treatment and he agrees to this plan. Specimen was given to [facility] from our office.  -Follow-up after nail biopsy or sooner if needed.   Celesta Gentile, DPM

## 2017-06-26 NOTE — Telephone Encounter (Addendum)
Dr. Jacqualyn Posey ordered toenail specimens to go to [facility] for definitive diagnosis for treatment. Dr. Jacqualyn Posey states pt does have fungus, offer lamisil 250mg  #90 one tablet daily and perform hepatic function and CBC with Diff prior beginning the lamisil. Dr. Jacqualyn Posey offered laser also.07/04/2017-I informed pt of Dr. Leigh Aurora orders and he wishes to begin the lamisil, but to hold off on the laser.

## 2017-06-26 NOTE — Progress Notes (Signed)
   Subjective:    Patient ID: John Barrett, male    DOB: 10-Aug-1951, 66 y.o.   MRN: [id number]  HPI    Review of Systems     Objective:   Physical Exam        Assessment & Plan:

## 2017-07-04 MED ORDER — TERBINAFINE HCL 250 MG PO TABS: 250.0000 mg | ORAL_TABLET | Freq: Every day | ORAL | 0 refills | Status: DC

## 2017-07-04 MED FILL — TERBINAFINE HCL 250 MG TAB: 250 | 90 days supply | Qty: 90 | Fill #0

## 2017-07-07 ENCOUNTER — Telehealth: Payer: Self-pay | Admitting: Podiatry

## 2017-07-07 DIAGNOSIS — Z79899 Other long term (current) drug therapy: Secondary | ICD-10-CM

## 2017-07-07 NOTE — Addendum Note (Signed)
Addended by: Harriett Sine D on: 07/07/2017 01:58 PM   Modules accepted: Orders

## 2017-07-07 NOTE — Telephone Encounter (Signed)
I am to have blood work done and it was sent to [facility] but I need it sent to [facility] instead.

## 2017-07-07 NOTE — Telephone Encounter (Addendum)
I spoke with John Barrett - [facility] she states fax rx for labs to [phone], have pt go to the [facility] and they will check him in and take him to the lab. Faxed orders to the lab. Left message informing pt on the instructions from [place].

## 2017-07-09 ENCOUNTER — Other Ambulatory Visit (HOSPITAL_COMMUNITY)
Admission: RE | Admit: 2017-07-09 | Discharge: 2017-07-09 | Disposition: A | Discharge status: Home / Self Care | Payer: 59 | Source: Ambulatory Visit | Attending: Podiatry | Admitting: Podiatry

## 2017-07-09 DIAGNOSIS — Z1159 Encounter for screening for other viral diseases: Secondary | ICD-10-CM | POA: Insufficient documentation

## 2017-07-09 DIAGNOSIS — Z0000 Encounter for general adult medical examination without abnormal findings: Secondary | ICD-10-CM | POA: Diagnosis not present

## 2017-07-09 LAB — CBC WITH DIFFERENTIAL/PLATELET
BASOS ABS: 0 10*3/uL (ref 0.0–0.1)
Basophils Relative: 1 %
Eosinophils Absolute: 0.1 10*3/uL (ref 0.0–0.7)
Eosinophils Relative: 1 %
HEMATOCRIT: 46.5 % (ref 39.0–52.0)
Hemoglobin: 15.2 g/dL (ref 13.0–17.0)
LYMPHS PCT: 16 %
Lymphs Abs: 1.2 10*3/uL (ref 0.7–4.0)
MCH: 29.5 pg (ref 26.0–34.0)
MCHC: 32.7 g/dL (ref 30.0–36.0)
MCV: 90.1 fL (ref 78.0–100.0)
MONO ABS: 0.6 10*3/uL (ref 0.1–1.0)
MONOS PCT: 9 %
NEUTROS ABS: 5.2 10*3/uL (ref 1.7–7.7)
Neutrophils Relative %: 73 %
PLATELETS: 200 10*3/uL (ref 150–400)
RBC: 5.16 MIL/uL (ref 4.22–5.81)
RDW: 13.8 % (ref 11.5–15.5)
WBC: 7.1 10*3/uL (ref 4.0–10.5)

## 2017-07-09 LAB — HEPATIC FUNCTION PANEL
ALT: 21 U/L (ref 17–63)
AST: 23 U/L (ref 15–41)
Albumin: 3.7 g/dL (ref 3.5–5.0)
Alkaline Phosphatase: 63 U/L (ref 38–126)
BILIRUBIN DIRECT: 0.2 mg/dL (ref 0.1–0.5)
BILIRUBIN TOTAL: 1.3 mg/dL — AB (ref 0.3–1.2)
Indirect Bilirubin: 1.1 mg/dL — ABNORMAL HIGH (ref 0.3–0.9)
Total Protein: 5.8 g/dL — ABNORMAL LOW (ref 6.5–8.1)

## 2017-07-10 NOTE — Telephone Encounter (Addendum)
-----   Message from Trula Slade, DPM sent at 07/10/2017  7:05 AM EDT ----- Ok to start lamisil- protein is low but it was a month ago otherwise normal. Please let him know. Thanks.   Follow-up 4-5 weeks. I informed pt of Dr. Leigh Aurora orders.

## 2017-12-03 DIAGNOSIS — Z0100 Encounter for examination of eyes and vision without abnormal findings: Secondary | ICD-10-CM | POA: Diagnosis not present

## 2017-12-18 ENCOUNTER — Ambulatory Visit: Payer: 59 | Admitting: Family Medicine

## 2017-12-18 ENCOUNTER — Encounter: Payer: Self-pay | Admitting: Family Medicine

## 2017-12-18 VITALS — BP 126/78 | HR 70 | Temp 98.4°F | Ht 73.0 in | Wt 184.4 lb

## 2017-12-18 DIAGNOSIS — M1712 Unilateral primary osteoarthritis, left knee: Secondary | ICD-10-CM

## 2017-12-18 DIAGNOSIS — L409 Psoriasis, unspecified: Secondary | ICD-10-CM | POA: Diagnosis not present

## 2017-12-18 NOTE — Patient Instructions (Signed)
I agree with you- this is possibly related to your psoriasis. Even if there is osteoarthritis- the treatment really is the same which would be as needed NSAIDs like advil or aleve. I think using aleve one to two times a day a few times a week is reasonable. If you find yourself needing this more regularly then lets check kidney function every 6 months just to be on the safe side.   We could get you in with rheumatology if symptoms worsen but my hope/suspicion is that is many years away.

## 2017-12-18 NOTE — Assessment & Plan Note (Addendum)
S: Starting to have some pain in left knee and with some swelling. Knee pain has been on and off for about 3 months. Knee pain gets up to 4/10 at worst..  When really bad he will cut down on cycling at [employer] from 50 to 30 mins but never goes away. 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 Thought about taking advil. Elbow pain started 2 months. Elbow pain gets up 7/10 pain with activity- sore to touch.   Was on orals for psoriasis last year- doesn't remember the names. This year has been able to do topical foams for most part.   No hand swelling. Does have nai pitting.   did 12 week treatment for fungus in toenails and did not improve at all. Has thickened toenails A/P: Patient with left elbow pain as well as left knee pain with slight effusion in warmth- mild to moderate pain and has not tried otc medicine yet. THis is potentially psoriatic arthritis, discussed that treatment for OA would be similar- offered x-rays of elbow and knee- he declines for now. Pains he has had are mild to moderate. We discussed role of NSAIDS and he opted to use aleve prn- discussed if using more than 2 days a week likely should do bmet q6 months. Offered rheumatology referral but he declined. Also discussed DMARDs like methotrexate but he does not feel he is at that point  Does have pitting of nails. Also thickening of toenails not responding to antifungal from podiatry Dr. Jacqualyn Posey despite 3 month course- suspect these are psoriatic issues and advised him to discuss with Dr. Denna Haggard

## 2017-12-18 NOTE — Progress Notes (Signed)
Subjective:  John Barrett is a 67 y.o. year old very pleasant male patient who presents for/with See problem oriented charting ROS- complains of pits in nails, thickening of toenails   Past Medical History-  Patient Active Problem List   Diagnosis Date Noted  . Arthritis of left knee 12/18/2017    Priority: High  . Psoriasis     Priority: High  . History of skin cancer     Priority: Medium  . History of adenomatous polyp of colon     Priority: Medium  . RESTLESS LEGS SYNDROME 07/27/2009    Priority: Medium  . Allergy     Priority: Low  . Rosacea     Priority: Low  . Grover's disease     Priority: Low  . Left knee pain 03/02/2013    Priority: Low  . BPH associated with nocturia 06/04/2017    Medications- reviewed and updated Current Outpatient Medications  Medication Sig Dispense Refill  . aspirin 81 MG tablet Take 81 mg by mouth daily.    . Calcipotriene-Betameth Diprop (ENSTILAR) 0.005-0.064 % FOAM Apply 1 application topically daily as needed.    . hydrocortisone cream 1 % Apply 1 application topically daily.    Javier Docker Oil 350 MG CAPS Take 1 capsule by mouth daily.    . Multiple Vitamin (MULTIVITAMIN) capsule Take 1 capsule by mouth daily.     No current facility-administered medications for this visit.     Objective: BP 126/78 (BP Location: Left Arm, Patient Position: Sitting, Cuff Size: Large)   Pulse 70   Temp 98.4 F (36.9 C) (Oral)   Ht 6\' 1"  (1.854 m)   Wt 184 lb 6.4 oz (83.6 kg)   SpO2 96%   BMI 24.33 kg/m  Gen: NAD, resting comfortably CV: RRR Lungs: nonlabored Ext: no edema Skin: warm, dry, several small plaques noted on skin. Pits in fingernails. Thickened toenails on multiple toes both feet.  MSK: slight effusion and warmth of the knee. Some pain with palpation left elbow  Left Knee: Normal to inspection otherwise with no obvious bony abnormalities. no joint line tenderness or patellar tenderness or condyle tenderness. ROM normal in  flexion and extension and lower leg rotation. Ligaments with solid consistent endpoints including ACL, PCL, LCL, MCL. Negative Mcmurray's and provocative meniscal tests.  Right knee exam normal  Left elbow without effusion- slight pain withpalpation  Assessment/Plan:  Arthritis of left knee S: Starting to have some pain in left knee and with some swelling. Knee pain has been on and off for about 3 months. Knee pain gets up to 4/10 at worst..  When really bad he will cut down on cycling at [employer] from 50 to 30 mins but never goes away. 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 Thought about taking advil. Elbow pain started 2 months. Elbow pain gets up 7/10 pain with activity- sore to touch.   Was on orals for psoriasis last year- doesn't remember the names. This year has been able to do topical foams for most part.   No hand swelling. Does have nai pitting.   did 12 week treatment for fungus in toenails and did not improve at all. Has thickened toenails A/P: Patient with left elbow pain as well as left knee pain with slight effusion in warmth- mild to moderate pain and has not tried otc medicine yet. THis is potentially psoriatic arthritis, discussed that treatment for OA would be similar- offered x-rays of elbow  and knee- he declines for now. Pains he has had are mild to moderate. We discussed role of NSAIDS and he opted to use aleve prn- discussed if using more than 2 days a week likely should do bmet q6 months. Offered rheumatology referral but he declined. Also discussed DMARDs like methotrexate but he does not feel he is at that point  Does have pitting of nails. Also thickening of toenails not responding to antifungal from podiatry Dr. Jacqualyn Posey despite 3 month course- suspect these are psoriatic issues and advised him to discuss with Dr. Denna Haggard  Time Stamp The duration of face-to-face time during this visit was greater than 15 minutes. Greater than 50%  of this time was spent in counseling, explanation of diagnosis, planning of further management, and/or coordination of care including discussion of potential causes of pain, discussion of psoriatic arthritis and treatment options, discussion of overlap between psoriatic mild treatments and OA, discussing referral and imaging options.    Return precautions advised.  Garret Reddish, MD

## 2018-01-07 ENCOUNTER — Other Ambulatory Visit: Payer: Self-pay | Admitting: Dermatology

## 2018-01-07 DIAGNOSIS — D485 Neoplasm of uncertain behavior of skin: Secondary | ICD-10-CM | POA: Diagnosis not present

## 2018-01-07 DIAGNOSIS — D229 Melanocytic nevi, unspecified: Secondary | ICD-10-CM | POA: Diagnosis not present

## 2018-01-07 DIAGNOSIS — B351 Tinea unguium: Secondary | ICD-10-CM | POA: Diagnosis not present

## 2018-01-07 DIAGNOSIS — L409 Psoriasis, unspecified: Secondary | ICD-10-CM | POA: Diagnosis not present

## 2018-01-29 ENCOUNTER — Encounter: Payer: Self-pay | Admitting: Family Medicine

## 2018-01-29 DIAGNOSIS — H903 Sensorineural hearing loss, bilateral: Secondary | ICD-10-CM | POA: Diagnosis not present

## 2018-03-17 MED FILL — ENSTILAR 0.005%-0.064% FOAM: 0.005-0.064 | 30 days supply | Qty: 60 | Fill #0 | Status: TO

## 2018-05-07 ENCOUNTER — Ambulatory Visit (INDEPENDENT_AMBULATORY_CARE_PROVIDER_SITE_OTHER): Payer: BLUE CROSS/BLUE SHIELD

## 2018-05-07 DIAGNOSIS — Z23 Encounter for immunization: Secondary | ICD-10-CM | POA: Diagnosis not present

## 2018-05-07 NOTE — Progress Notes (Signed)
Patient here for their first Shingles injection. Administered in his left deltoid. No reaction to vaccine.

## 2018-07-08 DIAGNOSIS — L409 Psoriasis, unspecified: Secondary | ICD-10-CM | POA: Diagnosis not present

## 2018-08-11 ENCOUNTER — Ambulatory Visit: Payer: BLUE CROSS/BLUE SHIELD

## 2018-08-11 DIAGNOSIS — Z23 Encounter for immunization: Secondary | ICD-10-CM

## 2018-08-11 NOTE — Progress Notes (Signed)
Per orders of Dr. Yong Channel, injection of Shingrix given L deltoid IM by Clearnce Sorrel Aileana Hodder, CMA.  This is second injection for patient, does not need to return to office. Patient tolerated injection well.

## 2018-09-23 ENCOUNTER — Other Ambulatory Visit: Payer: Self-pay | Admitting: Dermatology

## 2018-09-23 DIAGNOSIS — L570 Actinic keratosis: Secondary | ICD-10-CM | POA: Diagnosis not present

## 2018-09-23 DIAGNOSIS — D485 Neoplasm of uncertain behavior of skin: Secondary | ICD-10-CM | POA: Diagnosis not present

## 2018-09-23 DIAGNOSIS — L409 Psoriasis, unspecified: Secondary | ICD-10-CM | POA: Diagnosis not present

## 2019-04-05 DIAGNOSIS — H1033 Unspecified acute conjunctivitis, bilateral: Secondary | ICD-10-CM | POA: Diagnosis not present

## 2019-04-05 DIAGNOSIS — H5203 Hypermetropia, bilateral: Secondary | ICD-10-CM | POA: Diagnosis not present

## 2019-04-05 DIAGNOSIS — H2513 Age-related nuclear cataract, bilateral: Secondary | ICD-10-CM | POA: Diagnosis not present

## 2019-04-08 ENCOUNTER — Ambulatory Visit: Payer: BLUE CROSS/BLUE SHIELD | Admitting: Family Medicine

## 2019-04-08 ENCOUNTER — Encounter: Payer: Self-pay | Admitting: Family Medicine

## 2019-04-08 ENCOUNTER — Other Ambulatory Visit: Payer: Self-pay

## 2019-04-08 VITALS — BP 118/80 | HR 68 | Temp 98.7°F | Ht 73.0 in | Wt 170.4 lb

## 2019-04-08 DIAGNOSIS — W57XXXA Bitten or stung by nonvenomous insect and other nonvenomous arthropods, initial encounter: Secondary | ICD-10-CM

## 2019-04-08 DIAGNOSIS — Z23 Encounter for immunization: Secondary | ICD-10-CM | POA: Diagnosis not present

## 2019-04-08 DIAGNOSIS — S40262A Insect bite (nonvenomous) of left shoulder, initial encounter: Secondary | ICD-10-CM | POA: Diagnosis not present

## 2019-04-08 DIAGNOSIS — T63481A Toxic effect of venom of other arthropod, accidental (unintentional), initial encounter: Secondary | ICD-10-CM

## 2019-04-08 MED ORDER — CEPHALEXIN 500 MG PO CAPS: 500.0000 mg | ORAL_CAPSULE | Freq: Three times a day (TID) | ORAL | 0 refills | Status: AC

## 2019-04-08 NOTE — Progress Notes (Signed)
Phone [phone]   Subjective:  John Barrett is a 68 y.o. year old very pleasant male patient who presents for/with See problem oriented charting Chief Complaint  Patient presents with  . Acute Visit  . Insect Bite   ROS- No fever, chills, cough, shortness of breath, body aches, sore throat, or loss of taste or smell  Past Medical History-  Patient Active Problem List   Diagnosis Date Noted  . Arthritis of left knee 12/18/2017    Priority: High  . Psoriasis     Priority: High  . History of skin cancer     Priority: Medium  . History of adenomatous polyp of colon     Priority: Medium  . RESTLESS LEGS SYNDROME 07/27/2009    Priority: Medium  . Allergy     Priority: Low  . Rosacea     Priority: Low  . Grover's disease     Priority: Low  . Left knee pain 03/02/2013    Priority: Low  . BPH associated with nocturia 06/04/2017    Medications- reviewed and updated Current Outpatient Medications  Medication Sig Dispense Refill  . aspirin 81 MG tablet Take 81 mg by mouth daily.    . Calcipotriene-Betameth Diprop (ENSTILAR) 0.005-0.064 % FOAM Apply 1 application topically daily as needed.    . hydrocortisone cream 1 % Apply 1 application topically daily.    Marland Kitchen ketotifen (ZADITOR) 0.025 % ophthalmic solution 1 drop 2 (two) times daily.    Javier Docker Oil 350 MG CAPS Take 1 capsule by mouth daily.    . Multiple Vitamin (MULTIVITAMIN) capsule Take 1 capsule by mouth daily.    . Multiple Vitamins-Minerals (DRY EYE FORMULA PO) Take by mouth.    . cephALEXin (KEFLEX) 500 MG capsule Take 1 capsule (500 mg total) by mouth 3 (three) times daily for 7 days. 21 capsule 0   No current facility-administered medications for this visit.      Objective:  BP 118/80 (BP Location: Left Arm, Patient Position: Sitting, Cuff Size: Normal)   Pulse 68   Temp 98.7 F (37.1 C) (Oral)   Ht 6\' 1"  (1.854 m)   Wt 170 lb 6.4 oz (77.3 kg)   SpO2 97%   BMI 22.48 kg/m  Gen: NAD, resting  comfortably CV: RRR no murmurs rubs or gallops Lungs: CTAB no crackles, wheeze, rhonchi Ext: no edema Skin: warm, dry, left shoulder with erythema noted in 2 areas- on anterior portion most intense erythema is overlying area of at least dime sized induration without fluctuance- mild tenderness. Warmth noted in this area        Assessment and Plan   # Severe local reaction of Insect bite of left shoulder, initial encounter - Plan: Td vaccine greater than or equal to 7yo preservative free IM S: First noticed about 48 hours ago while in the water at [facility]. Bite on the left shoulder on posterior portion then same insect came back he believes and bit him on anterior shoulder- states looked like black moth but knew it was not a moth. Yesterday the anterior bite area was about dime size and very red. The area did grow in size but seems to be a little better today- has noted surrounding erythema as well. The area is warm to the touch and tender. Denies fever, drainage. The area does itch, some relief with Benadryl. The area is slightly painful.  A/P: I suspect this is primarily a severe local reaction- worsening size of lesion and expanding  redness concerning for cellulitis.  We will trial conservative therapy with Zyrtec or Allegra as well as Benadryl, icing- if that does not continue to improve the area- I sent him home with Keflex to use as we are also concerned about possible cellulitis. - Last tetanus shot was in 2013 and with significance of bite we opted to update TD today   Lab/Order associations:   ICD-10-CM   1. Insect bite of left shoulder, initial encounter  S40.262A Td vaccine greater than or equal to 7yo preservative free IM   W57.XXXA   2. Need for pneumococcal vaccination  Z23 Pneumococcal polysaccharide vaccine 23-valent greater than or equal to 2yo subcutaneous/IM   Meds ordered this encounter  Medications  . cephALEXin (KEFLEX) 500 MG capsule    Sig: Take 1 capsule  (500 mg total) by mouth 3 (three) times daily for 7 days.    Dispense:  21 capsule    Refill:  0   Return precautions advised.  Garret Reddish, MD

## 2019-04-08 NOTE — Patient Instructions (Addendum)
Pneumovax 23 today   Along with Td- under insect bite diagnosis (tetanus shot given last one over 7 years ago).   This is at minumum a severe local reaction- there is definitely some induration but I dont feel an obvious abscess.   I think it would be reasonable to have keflex on hand if this does not continue to improve with ice 15 mins 3x a day and zyrtec or allegra or claritin at night.

## 2019-06-03 DIAGNOSIS — M79674 Pain in right toe(s): Secondary | ICD-10-CM | POA: Diagnosis not present

## 2019-06-03 DIAGNOSIS — L409 Psoriasis, unspecified: Secondary | ICD-10-CM | POA: Diagnosis not present

## 2019-06-03 DIAGNOSIS — L608 Other nail disorders: Secondary | ICD-10-CM | POA: Diagnosis not present

## 2019-06-17 DIAGNOSIS — L603 Nail dystrophy: Secondary | ICD-10-CM | POA: Diagnosis not present

## 2019-06-17 DIAGNOSIS — L409 Psoriasis, unspecified: Secondary | ICD-10-CM | POA: Diagnosis not present

## 2019-06-26 ENCOUNTER — Other Ambulatory Visit: Payer: Self-pay

## 2019-06-26 ENCOUNTER — Emergency Department
Admission: EM | Admit: 2019-06-26 | Discharge: 2019-06-26 | Disposition: A | Discharge status: Home / Self Care | Payer: BLUE CROSS/BLUE SHIELD | Source: Home / Self Care | Attending: Family Medicine | Admitting: Family Medicine

## 2019-06-26 DIAGNOSIS — W57XXXA Bitten or stung by nonvenomous insect and other nonvenomous arthropods, initial encounter: Secondary | ICD-10-CM

## 2019-06-26 DIAGNOSIS — L089 Local infection of the skin and subcutaneous tissue, unspecified: Secondary | ICD-10-CM

## 2019-06-26 DIAGNOSIS — S80862A Insect bite (nonvenomous), left lower leg, initial encounter: Secondary | ICD-10-CM | POA: Diagnosis not present

## 2019-06-26 MED ORDER — DOXYCYCLINE HYCLATE 100 MG PO CAPS: 100.0000 mg | ORAL_CAPSULE | Freq: Two times a day (BID) | ORAL | 0 refills | Status: DC

## 2019-06-26 NOTE — Discharge Instructions (Addendum)
May apply 1% hydrocortisone cream as needed for itching. 

## 2019-06-26 NOTE — ED Triage Notes (Signed)
Pt says he was stung yesterday afternoon by an unknown insect. Redness spreading throughout today. Some itching. Has tried Cortizone cream and benedryl spray with little relief.

## 2019-06-26 NOTE — ED Provider Notes (Signed)
[facility]    CSN: [id number] Arrival date & time: 06/26/19  1438      History   Chief Complaint Chief Complaint  Patient presents with  . Insect Bite    Sting    HPI John Barrett is a 68 y.o. male.   Patient was stung by an unobserved insect yesterday on his left lower leg.  The area has become larger, more erythematous and tender to palpation.  He feels well otherwise.     Past Medical History:  Diagnosis Date  . Allergy    allergy eye drops prn  . Cellulitis    hospitalized 1995  . Chicken pox   . 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   . History of adenomatous polyp of colon    q5 years  . History of skin cancer    basal cell in past. Dr. Denna Haggard  . Psoriasis    has had to use orals in the past. Mainly controlled with triamcinolone per Dr. Denna Haggard  . Rosacea    hydrocortisone OTC per Dr. Denna Haggard  . Shingles    x2  . Soft tissue mass    left lower leg. removed by Dr. Harlow Asa    Patient Active Problem List   Diagnosis Date Noted  . Arthritis of left knee 12/18/2017  . Psoriasis   . BPH associated with nocturia 06/04/2017  . Allergy   . History of skin cancer   . Rosacea   . Grover's disease   . History of adenomatous polyp of colon   . Left knee pain 03/02/2013  . RESTLESS LEGS SYNDROME 07/27/2009    Past Surgical History:  Procedure Laterality Date  . APPENDECTOMY    . COLONOSCOPY    . HEMORRHOID BANDING  2008  . MASS EXCISION Left 07/29/2014   Procedure: EXCISION SOFT TISSUE MASS LEFT LOWER LEG;  Surgeon: Armandina Gemma, MD;  Location: [facility];  Service: General;  Laterality: Left;  . POLYPECTOMY    . TONSILLECTOMY AND ADENOIDECTOMY    . VEIN LIGATION     of penis. cialis in past for ED related to vein ligation/leak and BPH       Home Medications    Prior to Admission medications   Medication Sig Start Date End Date Taking? Authorizing Provider  aspirin 81 MG tablet Take 81  mg by mouth daily.    [provider]  Calcipotriene-Betameth Diprop (ENSTILAR) 0.005-0.064 % FOAM Apply 1 application topically daily as needed.    [provider]  doxycycline (VIBRAMYCIN) 100 MG capsule Take 1 capsule (100 mg total) by mouth 2 (two) times daily. Take with food. 06/26/19   Kandra Nicolas, MD  hydrocortisone cream 1 % Apply 1 application topically daily.    [provider]  ketotifen (ZADITOR) 0.025 % ophthalmic solution 1 drop 2 (two) times daily.    [provider]  Astrid Drafts 350 MG CAPS Take 1 capsule by mouth daily.    [provider]  Multiple Vitamin (MULTIVITAMIN) capsule Take 1 capsule by mouth daily.    [provider]  Multiple Vitamins-Minerals (DRY EYE FORMULA PO) Take by mouth.    [provider]    Family History Family History  Problem Relation Age of Onset  . Colon polyps Father   . Atrial fibrillation Father        coumadin. 91 and doing well  . Heart murmur Father  unsure which valve but has valve issue  . Other Mother        fall related at age 10 years old.  2010  . Other Maternal Grandmother        early 82s- "old age"  . Other Maternal Grandfather        unclear cause  . Lung cancer Paternal Grandmother        smoker  . Other Paternal Grandfather        unclear cause  . Sudden death Neg Hx   . Hypertension Neg Hx   . Hyperlipidemia Neg Hx   . Heart attack Neg Hx   . Diabetes Neg Hx   . Colon cancer Neg Hx   . Rectal cancer Neg Hx   . Stomach cancer Neg Hx     Social History Social History   Tobacco Use  . Smoking status: Never Smoker  . Smokeless tobacco: Never Used  Substance Use Topics  . Alcohol use: Yes    Alcohol/week: 4.0 - 5.0 standard drinks    Types: 4 - 5 Standard drinks or equivalent per week  . Drug use: No     Allergies   Patient has no known allergies.   Review of Systems Review of Systems  Constitutional: Negative for activity change,  chills, diaphoresis, fatigue and fever.  Skin: Positive for color change and rash.  All other systems reviewed and are negative.    Physical Exam Triage Vital Signs ED Triage Vitals  Enc Vitals Group     BP 06/26/19 1503 103/66     Pulse Rate 06/26/19 1503 74     Resp 06/26/19 1503 18     Temp 06/26/19 1503 97.8 F (36.6 C)     Temp Source 06/26/19 1503 Oral     SpO2 06/26/19 1503 96 %     Weight 06/26/19 1504 163 lb (73.9 kg)     Height 06/26/19 1504 6\' 1"  (1.854 m)     Head Circumference --      Peak Flow --      Pain Score 06/26/19 1503 0     Pain Loc --      Pain Edu? --      Excl. in [place]? --    No data found.  Updated Vital Signs BP 103/66 (BP Location: Right Arm)   Pulse 74   Temp 97.8 F (36.6 C) (Oral)   Resp 18   Ht 6\' 1"  (1.854 m)   Wt 73.9 kg   SpO2 96%   BMI 21.51 kg/m   Visual Acuity Right Eye Distance:   Left Eye Distance:   Bilateral Distance:    Right Eye Near:   Left Eye Near:    Bilateral Near:     Physical Exam Nursing notes and Vital Signs reviewed. Appearance:  Patient appears stated age, and in no acute distress.    Eyes:  Pupils are equal, round, and reactive to light and accomodation.  Pharynx:  moist mucous membranes  Lungs:   Normal respiration Heart:   Normal rate Extremities:  No edema.  Skin:   Tender erythematous macular lesion about 3inches diameter left lower leg.  No induration or fluctuance.    UC Treatments / Results  Labs (all labs ordered are listed, but only abnormal results are displayed) Labs Reviewed - No data to display  EKG   Radiology No results found.  Procedures Procedures (including critical care time)  Medications Ordered in UC Medications - No data to  display  Initial Impression / Assessment and Plan / UC Course  I have reviewed the triage vital signs and the nursing notes.  Pertinent labs & imaging results that were available during my care of the patient were reviewed by me and considered  in my medical decision making (see chart for details).    Begin doxycycline for staph coverage. Followup with Family Doctor if not improved in about 6 days.   Final Clinical Impressions(s) / UC Diagnoses   Final diagnoses:  Infected insect bite of left lower extremity, initial encounter     Discharge Instructions     May apply 1% hydrocortisone cream as needed for itching.    ED Prescriptions    Medication Sig Dispense Auth. Provider   doxycycline (VIBRAMYCIN) 100 MG capsule Take 1 capsule (100 mg total) by mouth 2 (two) times daily. Take with food. 14 capsule Kandra Nicolas, MD        Kandra Nicolas, MD 06/28/19 [phone]

## 2019-07-12 ENCOUNTER — Encounter: Payer: Self-pay | Admitting: Family Medicine

## 2019-07-12 ENCOUNTER — Other Ambulatory Visit: Payer: Self-pay

## 2019-07-12 ENCOUNTER — Ambulatory Visit (INDEPENDENT_AMBULATORY_CARE_PROVIDER_SITE_OTHER): Payer: BLUE CROSS/BLUE SHIELD | Admitting: Family Medicine

## 2019-07-12 VITALS — BP 118/76 | HR 66 | Temp 98.6°F | Resp 16 | Ht 73.0 in | Wt 163.6 lb

## 2019-07-12 DIAGNOSIS — R351 Nocturia: Secondary | ICD-10-CM | POA: Diagnosis not present

## 2019-07-12 DIAGNOSIS — R310 Gross hematuria: Secondary | ICD-10-CM

## 2019-07-12 DIAGNOSIS — N401 Enlarged prostate with lower urinary tract symptoms: Secondary | ICD-10-CM

## 2019-07-12 LAB — CBC WITH DIFFERENTIAL/PLATELET
Basophils Absolute: 0 10*3/uL (ref 0.0–0.1)
Basophils Relative: 0.7 % (ref 0.0–3.0)
Eosinophils Absolute: 0 10*3/uL (ref 0.0–0.7)
Eosinophils Relative: 0.9 % (ref 0.0–5.0)
HCT: 46.9 % (ref 39.0–52.0)
Hemoglobin: 15.4 g/dL (ref 13.0–17.0)
Lymphocytes Relative: 21.3 % (ref 12.0–46.0)
Lymphs Abs: 1.1 10*3/uL (ref 0.7–4.0)
MCHC: 32.9 g/dL (ref 30.0–36.0)
MCV: 91.7 fl (ref 78.0–100.0)
Monocytes Absolute: 0.4 10*3/uL (ref 0.1–1.0)
Monocytes Relative: 8.6 % (ref 3.0–12.0)
Neutro Abs: 3.6 10*3/uL (ref 1.4–7.7)
Neutrophils Relative %: 68.5 % (ref 43.0–77.0)
Platelets: 186 10*3/uL (ref 150.0–400.0)
RBC: 5.12 Mil/uL (ref 4.22–5.81)
RDW: 14 % (ref 11.5–15.5)
WBC: 5.2 10*3/uL (ref 4.0–10.5)

## 2019-07-12 LAB — BASIC METABOLIC PANEL
BUN: 14 mg/dL (ref 6–23)
CO2: 29 mEq/L (ref 19–32)
Calcium: 9.3 mg/dL (ref 8.4–10.5)
Chloride: 103 mEq/L (ref 96–112)
Creatinine, Ser: 0.79 mg/dL (ref 0.40–1.50)
GFR: 97.35 mL/min (ref >60.00)
Glucose, Bld: 100 mg/dL — ABNORMAL HIGH (ref 70–99)
Potassium: 4.2 mEq/L (ref 3.5–5.1)
Sodium: 140 mEq/L (ref 135–145)

## 2019-07-12 LAB — POCT URINALYSIS DIPSTICK
Bilirubin, UA: NEGATIVE
Blood, UA: NEGATIVE
Glucose, UA: NEGATIVE
Ketones, UA: NEGATIVE
Leukocytes, UA: NEGATIVE
Nitrite, UA: NEGATIVE
Protein, UA: NEGATIVE
Spec Grav, UA: 1.015 (ref 1.010–1.025)
Urobilinogen, UA: 0.2 E.U./dL
pH, UA: 6 (ref 5.0–8.0)

## 2019-07-12 LAB — PSA: PSA: 6.18 ng/mL — ABNORMAL HIGH (ref 0.10–4.00)

## 2019-07-12 NOTE — Progress Notes (Signed)
Subjective  CC:  Chief Complaint  Patient presents with  . Hematuria    Noticed 06/09/19.Marland Kitchen Reports dyuria, weak urine stream, blood (urine & in underwear).. States that symptoms were not present Saturday   Same day acute visit; PCP not available. New pt to me. Chart reviewed.   HPI: John Barrett is a 68 y.o. male who presents to the office today to address the problems listed above in the chief complaint.  68 yo male with h/o BPH and mild sxs presents due to recent event with gross hematuria and bleeding from urethra. Occurred 3 days ago, [place]: stopped at rest stop to urinate: had decreased stream and strained to help; felt sharp twinge of pain. Completed voided. Later, noted BRB on underpants and then bled several times with voids for the next 12 hours. Since, no further sxs or bleeding. Nl urine w/o dysuria, pelvic pain, flank pain or h/o gross hematuria. No back pain or fevers. He is not on blood thinners. No trauma. Feels well today.   Has appt for flu shot sept 17th Assessment  1. Gross hematuria   2. BPH associated with nocturia      Plan   Gross hematuria/urethral bleeding:  Unclear etiology. Check labs, PSA screen and monitor sxs. IF recurs, needs urology evaluation. Patient understands and agrees with care plan.    Follow up: prn  Visit date not found  Orders Placed This Encounter  Procedures  . Urine Culture  . CBC with Differential/Platelet  . Basic metabolic panel  . PSA  . POCT urinalysis dipstick   No orders of the defined types were placed in this encounter.     I reviewed the patients updated PMH, FH, and SocHx.    Patient Active Problem List   Diagnosis Date Noted  . Arthritis of left knee 12/18/2017  . Psoriasis   . BPH associated with nocturia 06/04/2017  . Allergy   . History of skin cancer   . Rosacea   . Grover's disease   . History of adenomatous polyp of colon   . Left knee pain 03/02/2013  . RESTLESS LEGS  SYNDROME 07/27/2009   Current Meds  Medication Sig  . aspirin 81 MG tablet Take 81 mg by mouth daily.  Javier Docker Oil 350 MG CAPS Take 1 capsule by mouth daily.  . Multiple Vitamin (MULTIVITAMIN) capsule Take 1 capsule by mouth daily.  . Multiple Vitamins-Minerals (DRY EYE FORMULA PO) Take by mouth.    Allergies: Patient has No Known Allergies. Family History: Patient family history includes Atrial fibrillation in his father; Colon polyps in his father; Heart murmur in his father; Lung cancer in his paternal grandmother; Other in his maternal grandfather, maternal grandmother, mother, and paternal grandfather. Social History:  Patient  reports that he has never smoked. He has never used smokeless tobacco. He reports current alcohol use of about 4.0 - 5.0 standard drinks of alcohol per week. He reports that he does not use drugs.  Review of Systems: Constitutional: Negative for fever malaise or anorexia Cardiovascular: negative for chest pain Respiratory: negative for SOB or persistent cough Gastrointestinal: negative for abdominal pain  Objective  Vitals: BP 118/76   Pulse 66   Temp 98.6 F (37 C) (Tympanic)   Resp 16   Ht 6\' 1"  (1.854 m)   Wt 163 lb 9.6 oz (74.2 kg)   SpO2 97%   BMI 21.58 kg/m  General: no acute distress , A&Ox3 HEENT: PEERL, conjunctiva normal,  Oropharynx moist,neck is supple Cardiovascular:  RRR without murmur or gallop.  Respiratory:  Good breath sounds bilaterally, CTAB with normal respiratory effort Gastrointestinal: soft, flat abdomen, normal active bowel sounds, no palpable masses, no hepatosplenomegaly, no appreciated hernias, no cva ttp. No suprapubic ttp Skin:  Warm, no rashes  Office Visit on 07/12/2019  Component Date Value Ref Range Status  . Color, UA 07/12/2019 Amber   Final  . Clarity, UA 07/12/2019 Clear   Final  . Glucose, UA 07/12/2019 Negative  Negative Final  . Bilirubin, UA 07/12/2019 Negative   Final  . Ketones, UA 07/12/2019  Negative   Final  . Spec Grav, UA 07/12/2019 1.015  1.010 - 1.025 Final  . Blood, UA 07/12/2019 Negative   Final  . pH, UA 07/12/2019 6.0  5.0 - 8.0 Final  . Protein, UA 07/12/2019 Negative  Negative Final  . Urobilinogen, UA 07/12/2019 0.2  0.2 or 1.0 E.U./dL Final  . Nitrite, UA 07/12/2019 Negative   Final  . Leukocytes, UA 07/12/2019 Negative  Negative Final      Commons side effects, risks, benefits, and alternatives for medications and treatment plan prescribed today were discussed, and the patient expressed understanding of the given instructions. Patient is instructed to call or message via MyChart if he/she has any questions or concerns regarding our treatment plan. No barriers to understanding were identified. We discussed Red Flag symptoms and signs in detail. Patient expressed understanding regarding what to do in case of urgent or emergency type symptoms.   Medication list was reconciled, printed and provided to the patient in AVS. Patient instructions and summary information was reviewed with the patient as documented in the AVS. This note was prepared with assistance of Dragon voice recognition software. Occasional wrong-word or sound-a-like substitutions may have occurred due to the inherent limitations of voice recognition software

## 2019-07-12 NOTE — Patient Instructions (Signed)
Please follow up if symptoms do not improve or as needed.    Hematuria, Adult Hematuria is blood in the urine. Blood may be visible in the urine, or it may be identified with a test. This condition can be caused by infections of the bladder, urethra, kidney, or prostate. Other possible causes include:  Kidney stones.  Cancer of the urinary tract.  Too much calcium in the urine.  Conditions that are passed from parent to child (inherited conditions).  Exercise that requires a lot of energy. Infections can usually be treated with medicine, and a kidney stone usually will pass through your urine. If neither of these is the cause of your hematuria, more tests may be needed to identify the cause of your symptoms. It is very important to tell your health care provider about any blood in your urine, even if it is painless or the blood stops without treatment. Blood in the urine, when it happens and then stops and then happens again, can be a symptom of a very serious condition, including cancer. There is no pain in the initial stages of many urinary cancers. Follow these instructions at home: Medicines  Take over-the-counter and prescription medicines only as told by your health care provider.  If you were prescribed an antibiotic medicine, take it as told by your health care provider. Do not stop taking the antibiotic even if you start to feel better. Eating and drinking  Drink enough fluid to keep your urine clear or pale yellow. It is recommended that you drink 3-4 quarts (2.8-3.8 L) a day. If you have been diagnosed with an infection, it is recommended that you drink cranberry juice in addition to large amounts of water.  Avoid caffeine, tea, and carbonated beverages. These tend to irritate the bladder.  Avoid alcohol because it may irritate the prostate (men). General instructions  If you have been diagnosed with a kidney stone, follow your health care provider's instructions about  straining your urine to catch the stone.  Empty your bladder often. Avoid holding urine for long periods of time.  If you are male: ? After a bowel movement, wipe from front to back and use each piece of toilet paper only once. ? Empty your bladder before and after sex.  Pay attention to any changes in your symptoms. Tell your health care provider about any changes or any new symptoms.  It is your responsibility to get your test results. Ask your health care provider, or the department performing the test, when your results will be ready.  Keep all follow-up visits as told by your health care provider. This is important. Contact a health care provider if:  You develop back pain.  You have a fever.  You have nausea or vomiting.  Your symptoms do not improve after 3 days.  Your symptoms get worse. Get help right away if:  You develop severe vomiting and are unable take medicine without vomiting.  You develop severe pain in your back or abdomen even though you are taking medicine.  You pass a large amount of blood in your urine.  You pass blood clots in your urine.  You feel very weak or like you might faint.  You faint. Summary  Hematuria is blood in the urine. It has many possible causes.  It is very important that you tell your health care provider about any blood in your urine, even if it is painless or the blood stops without treatment.  Take over-the-counter and prescription medicines  only as told by your health care provider.  Drink enough fluid to keep your urine clear or pale yellow. This information is not intended to replace advice given to you by your health care provider. Make sure you discuss any questions you have with your health care provider. Document Released: 10/14/2005 Document Revised: 09/26/2017 Document Reviewed: 11/16/2016 Elsevier Patient Education  2020 [employer].

## 2019-07-13 ENCOUNTER — Other Ambulatory Visit: Payer: Self-pay | Admitting: *Deleted

## 2019-07-13 DIAGNOSIS — R972 Elevated prostate specific antigen [PSA]: Secondary | ICD-10-CM

## 2019-07-14 LAB — URINE CULTURE
MICRO NUMBER:: 877702
Result:: NO GROWTH
SPECIMEN QUALITY:: ADEQUATE

## 2019-08-06 DIAGNOSIS — R310 Gross hematuria: Secondary | ICD-10-CM | POA: Diagnosis not present

## 2019-08-06 DIAGNOSIS — R350 Frequency of micturition: Secondary | ICD-10-CM | POA: Diagnosis not present

## 2019-08-06 DIAGNOSIS — K4020 Bilateral inguinal hernia, without obstruction or gangrene, not specified as recurrent: Secondary | ICD-10-CM | POA: Diagnosis not present

## 2019-08-06 DIAGNOSIS — R972 Elevated prostate specific antigen [PSA]: Secondary | ICD-10-CM | POA: Diagnosis not present

## 2019-08-10 ENCOUNTER — Other Ambulatory Visit: Payer: Self-pay | Admitting: Dermatology

## 2019-08-10 DIAGNOSIS — L111 Transient acantholytic dermatosis [Grover]: Secondary | ICD-10-CM | POA: Diagnosis not present

## 2019-08-10 DIAGNOSIS — D485 Neoplasm of uncertain behavior of skin: Secondary | ICD-10-CM | POA: Diagnosis not present

## 2019-08-10 DIAGNOSIS — D229 Melanocytic nevi, unspecified: Secondary | ICD-10-CM | POA: Diagnosis not present

## 2019-08-10 DIAGNOSIS — L570 Actinic keratosis: Secondary | ICD-10-CM | POA: Diagnosis not present

## 2019-12-13 ENCOUNTER — Telehealth: Payer: Self-pay

## 2019-12-13 NOTE — Telephone Encounter (Signed)
Called patient answered all questions about exposure he is not symptomatic and has been over 5 days but will set up testing to be on safe side.  I have updated covid immunizations in chart. Has not been 2 weeks from second vaccination.

## 2019-12-13 NOTE — Telephone Encounter (Signed)
Patient have question regarding covid. Please return pt call

## 2020-01-06 ENCOUNTER — Encounter: Payer: Self-pay | Admitting: Gastroenterology

## 2020-01-18 ENCOUNTER — Telehealth: Payer: Self-pay

## 2020-01-18 NOTE — Telephone Encounter (Signed)
Unable to leave vm regarding flu vaccine

## 2020-02-04 LAB — PSA: PSA: 4.66

## 2020-02-18 ENCOUNTER — Other Ambulatory Visit: Payer: Self-pay

## 2020-02-18 ENCOUNTER — Ambulatory Visit (AMBULATORY_SURGERY_CENTER): Payer: Self-pay | Admitting: *Deleted

## 2020-02-18 VITALS — Temp 97.6°F | Ht 73.0 in | Wt 168.0 lb

## 2020-02-18 DIAGNOSIS — Z86010 Personal history of colonic polyps: Secondary | ICD-10-CM

## 2020-02-18 MED ORDER — PEG 3350-KCL-NA BICARB-NACL 420 G PO SOLR: 4000.0000 mL | Freq: Once | ORAL | 0 refills | Status: AC

## 2020-02-18 NOTE — Progress Notes (Signed)
Patient denies any allergies to egg or soy products. Patient denies complications with anesthesia/sedation.  Patient denies oxygen use at home and denies diet medications. .  Had Second covid vacciine was on 12/03/19.

## 2020-03-03 ENCOUNTER — Encounter: Payer: Self-pay | Admitting: Gastroenterology

## 2020-03-03 ENCOUNTER — Other Ambulatory Visit: Payer: Self-pay

## 2020-03-03 ENCOUNTER — Ambulatory Visit (AMBULATORY_SURGERY_CENTER): Payer: Medicare Other | Admitting: Gastroenterology

## 2020-03-03 VITALS — BP 96/58 | HR 52 | Temp 95.9°F | Resp 19 | Ht 73.0 in | Wt 168.0 lb

## 2020-03-03 DIAGNOSIS — D123 Benign neoplasm of transverse colon: Secondary | ICD-10-CM | POA: Diagnosis not present

## 2020-03-03 DIAGNOSIS — D122 Benign neoplasm of ascending colon: Secondary | ICD-10-CM | POA: Diagnosis not present

## 2020-03-03 DIAGNOSIS — Z86010 Personal history of colonic polyps: Secondary | ICD-10-CM | POA: Diagnosis not present

## 2020-03-03 MED ORDER — SODIUM CHLORIDE 0.9 % IV SOLN: 500.0000 mL | Freq: Once | INTRAVENOUS | Status: DC

## 2020-03-03 NOTE — Op Note (Signed)
[place] [facility] Patient Name: John Barrett Procedure Date: 03/03/2020 8:50 AM MRN: [id number] Endoscopist: Ladene Artist , MD Age: 69 Referring MD:  Date of Birth: [birth year] Gender: Male Account #: [id number] Procedure:                Colonoscopy Indications:              Surveillance: Personal history of adenomatous                            polyps on last colonoscopy 5 years ago Medicines:                Monitored Anesthesia Care Procedure:                Pre-Anesthesia Assessment:                           - Prior to the procedure, a History and Physical                            was performed, and patient medications and                            allergies were reviewed. The patient's tolerance of                            previous anesthesia was also reviewed. The risks                            and benefits of the procedure and the sedation                            options and risks were discussed with the patient.                            All questions were answered, and informed consent                            was obtained. Prior Anticoagulants: The patient has                            taken no previous anticoagulant or antiplatelet                            agents. ASA Grade Assessment: II - A patient with                            mild systemic disease. After reviewing the risks                            and benefits, the patient was deemed in                            satisfactory condition to undergo the procedure.  After obtaining informed consent, the colonoscope                            was passed under direct vision. Throughout the                            procedure, the patient's blood pressure, pulse, and                            oxygen saturations were monitored continuously. The                            Colonoscope was introduced through the anus and                            advanced to the the cecum,  identified by                            appendiceal orifice and ileocecal valve. The                            ileocecal valve, appendiceal orifice, and rectum                            were photographed. The quality of the bowel                            preparation was good. The colonoscopy was performed                            without difficulty. The patient tolerated the                            procedure well. Scope In: 9:02:09 AM Scope Out: 9:19:29 AM Scope Withdrawal Time: 0 hours 13 minutes 36 seconds  Total Procedure Duration: 0 hours 17 minutes 20 seconds  Findings:                 The perianal and digital rectal examinations were                            normal.                           Three sessile polyps were found in the transverse                            colon and ascending colon. The polyps were 7 to 8                            mm in size. These polyps were removed with a cold                            snare. Resection and retrieval were complete.  Scattered medium-mouthed diverticula were found in                            the right colon. There was no evidence of                            diverticular bleeding.                           Internal hemorrhoids were found during                            retroflexion. The hemorrhoids were small and Grade                            I (internal hemorrhoids that do not prolapse).                           The exam was otherwise without abnormality on                            direct and retroflexion views. Complications:            No immediate complications. Estimated blood loss:                            None. Estimated Blood Loss:     Estimated blood loss: none. Impression:               - Three 7 to 8 mm polyps in the transverse colon                            and in the ascending colon, removed with a cold                            snare. Resected and retrieved.                            - Mild diverticulosis in the right colon.                           - Internal hemorrhoids.                           - The examination was otherwise normal on direct                            and retroflexion views. Recommendation:           - Repeat colonoscopy after studies are complete for                            surveillance based on pathology results.                           - Patient has a contact number available for  emergencies. The signs and symptoms of potential                            delayed complications were discussed with the                            patient. Return to normal activities tomorrow.                            Written discharge instructions were provided to the                            patient.                           - High fiber diet.                           - Continue present medications.                           - Await pathology results. Ladene Artist, MD 03/03/2020 9:23:02 AM This report has been signed electronically.

## 2020-03-03 NOTE — Progress Notes (Signed)
Pt's states no medical or surgical changes since previsit or office visit. 

## 2020-03-03 NOTE — Progress Notes (Signed)
Called to room to assist during endoscopic procedure.  Patient ID and intended procedure confirmed with present staff. Received instructions for my participation in the procedure from the performing physician.  

## 2020-03-03 NOTE — Patient Instructions (Signed)
Please read handouts provided. Continue present medications. Await pathology results. High Fiber diet.     YOU HAD AN ENDOSCOPIC PROCEDURE TODAY AT THE [facility]:   Refer to the procedure report that was given to you for any specific questions about what was found during the examination.  If the procedure report does not answer your questions, please call your gastroenterologist to clarify.  If you requested that your care partner not be given the details of your procedure findings, then the procedure report has been included in a sealed envelope for you to review at your convenience later.  YOU SHOULD EXPECT: Some feelings of bloating in the abdomen. Passage of more gas than usual.  Walking can help get rid of the air that was put into your GI tract during the procedure and reduce the bloating. If you had a lower endoscopy (such as a colonoscopy or flexible sigmoidoscopy) you may notice spotting of blood in your stool or on the toilet paper. If you underwent a bowel prep for your procedure, you may not have a normal bowel movement for a few days.  Please Note:  You might notice some irritation and congestion in your nose or some drainage.  This is from the oxygen used during your procedure.  There is no need for concern and it should clear up in a day or so.  SYMPTOMS TO REPORT IMMEDIATELY:   Following lower endoscopy (colonoscopy or flexible sigmoidoscopy):  Excessive amounts of blood in the stool  Significant tenderness or worsening of abdominal pains  Swelling of the abdomen that is new, acute  Fever of 100F or higher   For urgent or emergent issues, a gastroenterologist can be reached at any hour by calling [phone]. Do not use MyChart messaging for urgent concerns.    DIET:  We do recommend a small meal at first, but then you may proceed to your regular diet.  Drink plenty of fluids but you should avoid alcoholic beverages for 24 hours.  ACTIVITY:  You should  plan to take it easy for the rest of today and you should NOT DRIVE or use heavy machinery until tomorrow (because of the sedation medicines used during the test).    FOLLOW UP: Our staff will call the number listed on your records 48-72 hours following your procedure to check on you and address any questions or concerns that you may have regarding the information given to you following your procedure. If we do not reach you, we will leave a message.  We will attempt to reach you two times.  During this call, we will ask if you have developed any symptoms of COVID 19. If you develop any symptoms (ie: fever, flu-like symptoms, shortness of breath, cough etc.) before then, please call [phone].  If you test positive for Covid 19 in the 2 weeks post procedure, please call and report this information to us.    If any biopsies were taken you will be contacted by phone or by letter within the next 1-3 weeks.  Please call us at [phone] if you have not heard about the biopsies in 3 weeks.    SIGNATURES/CONFIDENTIALITY: You and/or your care partner have signed paperwork which will be entered into your electronic medical record.  These signatures attest to the fact that that the information above on your After Visit Summary has been reviewed and is understood.  Full responsibility of the confidentiality of this discharge information lies with you and/or your care-partner. 

## 2020-03-03 NOTE — Progress Notes (Signed)
pt tolerated well. VSS. awake and to recovery. Report given to RN.  

## 2020-03-07 ENCOUNTER — Telehealth: Payer: Self-pay

## 2020-03-07 NOTE — Telephone Encounter (Signed)
  Follow up Call-  Call back number 03/03/2020  Post procedure Call Back phone  # [phone]  Permission to leave phone message Yes  Some recent data might be hidden     Patient questions:  Do you have a fever, pain , or abdominal swelling? No. Pain Score  0 *  Have you tolerated food without any problems? Yes.    Have you been able to return to your normal activities? Yes.    Do you have any questions about your discharge instructions: Diet   No. Medications  No. Follow up visit  No.  Do you have questions or concerns about your Care? No.  Actions: * If pain score is 4 or above: No action needed, pain <4.  1. Have you developed a fever since your procedure? no  2.   Have you had an respiratory symptoms (SOB or cough) since your procedure? no  3.   Have you tested positive for COVID 19 since your procedure no  4.   Have you had any family members/close contacts diagnosed with the COVID 19 since your procedure?  no   If yes to any of these questions please route to Joylene John, RN and Erenest Rasher, RN

## 2020-03-09 ENCOUNTER — Encounter: Payer: Self-pay | Admitting: Gastroenterology

## 2020-05-10 ENCOUNTER — Encounter: Payer: Self-pay | Admitting: Dermatology

## 2020-05-10 ENCOUNTER — Ambulatory Visit (INDEPENDENT_AMBULATORY_CARE_PROVIDER_SITE_OTHER): Payer: Medicare Other | Admitting: Dermatology

## 2020-05-10 ENCOUNTER — Other Ambulatory Visit: Payer: Self-pay

## 2020-05-10 DIAGNOSIS — D229 Melanocytic nevi, unspecified: Secondary | ICD-10-CM

## 2020-05-10 DIAGNOSIS — L111 Transient acantholytic dermatosis [Grover]: Secondary | ICD-10-CM

## 2020-05-10 DIAGNOSIS — D099 Carcinoma in situ, unspecified: Secondary | ICD-10-CM

## 2020-05-10 DIAGNOSIS — D225 Melanocytic nevi of trunk: Secondary | ICD-10-CM

## 2020-05-10 DIAGNOSIS — C4492 Squamous cell carcinoma of skin, unspecified: Secondary | ICD-10-CM

## 2020-05-10 DIAGNOSIS — D485 Neoplasm of uncertain behavior of skin: Secondary | ICD-10-CM

## 2020-05-10 HISTORY — DX: Squamous cell carcinoma of skin, unspecified: C44.92

## 2020-05-10 HISTORY — DX: Carcinoma in situ, unspecified: D09.9

## 2020-05-10 MED ORDER — TACROLIMUS 0.1 % EX OINT: TOPICAL_OINTMENT | Freq: Two times a day (BID) | CUTANEOUS | 2 refills | Status: DC

## 2020-05-12 ENCOUNTER — Telehealth: Payer: Self-pay

## 2020-05-12 NOTE — Telephone Encounter (Signed)
-----   Message from Lavonna Monarch, MD sent at 05/12/2020  7:27 AM EDT ----- #2 schedule 30 with DrT

## 2020-05-12 NOTE — Telephone Encounter (Signed)
Phone call to patient with his pathology results. Patient aware of results.  

## 2020-06-04 ENCOUNTER — Encounter: Payer: Self-pay | Admitting: Dermatology

## 2020-06-04 NOTE — Progress Notes (Signed)
Follow-Up Visit   Subjective  John Barrett is a 68 y.o. male who presents for the following: Annual Exam (under left ear, right lower neck,top of right should, right nipple, area under throat, left side of chin, left hand middle finger- nothing itches or bleeds, things just changed).  New growths Location: Right arm, shoulder, back, chin Duration:  Quality:  Associated Signs/Symptoms: Modifying Factors:  Severity:  Timing: Context: History of nonmelanoma skin cancers and atypical moles. Objective  Well appearing patient in no apparent distress; mood and affect are within normal limits.  All skin waist up examined. Skin cancer screening performed today.   Assessment & Plan    Neoplasm of uncertain behavior of skin (4) Right Upper Back  Skin / nail biopsy Type of biopsy: tangential   Informed consent: discussed and consent obtained   Timeout: patient name, date of birth, surgical site, and procedure verified   Procedure prep:  Patient was prepped and draped in usual sterile fashion Prep type:  Chlorhexidine Anesthesia: the lesion was anesthetized in a standard fashion   Anesthetic:  1% lidocaine w/ epinephrine 1-100,000 local infiltration Instrument used: flexible razor blade   Hemostasis achieved with: ferric subsulfate   Outcome: patient tolerated procedure well   Post-procedure details: wound care instructions given    Specimen 1 - Surgical pathology Differential Diagnosis: SCC vs BCC Check Margins: No  Mid Back  Skin / nail biopsy Type of biopsy: tangential   Informed consent: discussed and consent obtained   Timeout: patient name, date of birth, surgical site, and procedure verified   Procedure prep:  Patient was prepped and draped in usual sterile fashion Prep type:  Chlorhexidine Anesthesia: the lesion was anesthetized in a standard fashion   Anesthetic:  1% lidocaine w/ epinephrine 1-100,000 local infiltration Instrument used: flexible razor  blade   Hemostasis achieved with: ferric subsulfate   Outcome: patient tolerated procedure well   Post-procedure details: wound care instructions given    Specimen 2 - Surgical pathology Differential Diagnosis:  SCC vs BCC Check Margins: No  Right Upper Arm - Anterior  Skin / nail biopsy Type of biopsy: tangential   Informed consent: discussed and consent obtained   Timeout: patient name, date of birth, surgical site, and procedure verified   Procedure prep:  Patient was prepped and draped in usual sterile fashion Prep type:  Chlorhexidine Anesthesia: the lesion was anesthetized in a standard fashion   Anesthetic:  1% lidocaine w/ epinephrine 1-100,000 local infiltration Instrument used: flexible razor blade   Hemostasis achieved with: ferric subsulfate   Outcome: patient tolerated procedure well   Post-procedure details: wound care instructions given    Specimen 3 - Surgical pathology Differential Diagnosis:  SCC vs BCC Check Margins: No  Left Lower Cutaneous Lip  Skin / nail biopsy Type of biopsy: tangential   Informed consent: discussed and consent obtained   Timeout: patient name, date of birth, surgical site, and procedure verified   Procedure prep:  Patient was prepped and draped in usual sterile fashion Prep type:  Chlorhexidine Anesthesia: the lesion was anesthetized in a standard fashion   Anesthetic:  1% lidocaine w/ epinephrine 1-100,000 local infiltration Instrument used: flexible razor blade   Hemostasis achieved with: ferric subsulfate   Outcome: patient tolerated procedure well   Post-procedure details: wound care instructions given    Specimen 4 - Surgical pathology Differential Diagnosis:  SCC vs BCC Check Margins: No  Nevus Mid Back  Self examination with spouse twice annually, dermatologist  examination yearly.  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  The documentation on 06/04/20 for the exam, diagnosis, procedures, and orders are all accurate and complete.

## 2020-06-29 ENCOUNTER — Ambulatory Visit (INDEPENDENT_AMBULATORY_CARE_PROVIDER_SITE_OTHER): Payer: Medicare Other | Admitting: Dermatology

## 2020-06-29 ENCOUNTER — Encounter: Payer: Self-pay | Admitting: Dermatology

## 2020-06-29 ENCOUNTER — Other Ambulatory Visit: Payer: Self-pay

## 2020-06-29 DIAGNOSIS — D099 Carcinoma in situ, unspecified: Secondary | ICD-10-CM

## 2020-06-29 DIAGNOSIS — L570 Actinic keratosis: Secondary | ICD-10-CM

## 2020-06-29 DIAGNOSIS — D045 Carcinoma in situ of skin of trunk: Secondary | ICD-10-CM | POA: Diagnosis not present

## 2020-06-29 NOTE — Patient Instructions (Addendum)
Surgical follow-up for John Barrett date of birth Apr 01, 1951.  Only the biopsy on the mid back of the 4 biopsies taken on last visit showed a skin cancer and it was an in situ  carcinoma.  As expected, curettage showed no deep extension so no cutting or stitching was required.  He can expect to have an abrasion of this area for approximately 2 weeks.  There are no bathing or activity restrictions.  He knows he can call me anytime if there is any problem with the surgical site.  Additionally Tim pointed out some persistent crusts on each side of the scalp; these clinically could fit thick precancers, superficial cancer, or inflamed keratoses.  Liquid nitrogen spray for 5 seconds to the 1 largest spot on the left parietal and 2 smaller ones on the right crown.  If this fails, he will return for biopsies.

## 2020-06-29 NOTE — Progress Notes (Signed)
LN2 SCALP X 2 PER DR Denna Haggard

## 2020-07-27 ENCOUNTER — Encounter: Payer: Self-pay | Admitting: Dermatology

## 2020-07-27 NOTE — Progress Notes (Signed)
   Follow-Up Visit   Subjective  John Barrett is a 69 y.o. male who presents for the following: Procedure (CIS x 1 right upper back   ).  CIS Location: Back Duration:  Quality:  Associated Signs/Symptoms: Modifying Factors:  Severity:  Timing: Context: Also concerned with some crust on his scalp  Objective  Well appearing patient in no apparent distress; mood and affect are within normal limits.  All skin waist up examined.   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  Destruction of lesion Complexity: simple   Destruction method: electrodesiccation and curettage   Informed consent: discussed and consent obtained   Timeout:  patient name, date of birth, surgical site, and procedure verified Anesthesia: the lesion was anesthetized in a standard fashion   Anesthetic:  1% lidocaine w/ epinephrine 1-100,000 local infiltration Curettage performed in three different directions: Yes   Curettage cycles:  3 Lesion length (cm):  2.5 Lesion width (cm):  1 Margin per side (cm):  0 Final wound size (cm):  2.5 Hemostasis achieved with:  ferric subsulfate Outcome: patient tolerated procedure well with no complications   Post-procedure details: wound care instructions given   Additional details:  Inoculated with parenteral 5% fluorouracil  AK (actinic keratosis) (3) Mid Parietal Scalp  Destruction of lesion - Mid Parietal Scalp (2)  Destruction method: cryotherapy   Informed consent: discussed and consent obtained   Timeout:  patient name, date of birth, surgical site, and procedure verified Lesion destroyed using liquid nitrogen: Yes   Region frozen until ice ball extended beyond lesion: Yes   Cryotherapy cycles:  5 Outcome: patient tolerated procedure well with no complications     Surgical follow-up for John Barrett date of birth [birth year].  Only the biopsy on the mid back of the 4 biopsies taken on last visit showed a skin cancer  and it was an in situ  carcinoma.  As expected, curettage showed no deep extension so no cutting or stitching was required.  He can expect to have an abrasion of this area for approximately 2 weeks.  There are no bathing or activity restrictions.  He knows he can call me anytime if there is any problem with the surgical site.  Additionally Tim pointed out some persistent crusts on each side of the scalp; these clinically could fit thick precancers, superficial cancer, or inflamed keratoses.  Liquid nitrogen spray for 5 seconds to the 1 largest spot on the left parietal and 2 smaller ones on the right crown.  If this fails, he will return for biopsies.   I, Lavonna Monarch, MD, have reviewed all documentation for this visit.  The documentation on 07/27/20 for the exam, diagnosis, procedures, and orders are all accurate and complete.

## 2020-11-24 ENCOUNTER — Telehealth: Payer: Medicare Other | Admitting: Physician Assistant

## 2020-11-24 ENCOUNTER — Other Ambulatory Visit: Payer: Self-pay

## 2020-11-24 NOTE — Progress Notes (Signed)
Patient was not available during appointment time.  Was not seen by me.  No charge.  Inda Coke PA-C

## 2020-11-25 ENCOUNTER — Telehealth (INDEPENDENT_AMBULATORY_CARE_PROVIDER_SITE_OTHER): Payer: Medicare Other | Admitting: Family Medicine

## 2020-11-25 ENCOUNTER — Encounter: Payer: Self-pay | Admitting: Family Medicine

## 2020-11-25 DIAGNOSIS — M25512 Pain in left shoulder: Secondary | ICD-10-CM

## 2020-11-25 DIAGNOSIS — G8929 Other chronic pain: Secondary | ICD-10-CM | POA: Diagnosis not present

## 2020-11-25 NOTE — Assessment & Plan Note (Signed)
Pt with chronic left shoulder pain previously responded to PT and requests new referral. Referral placed. Suspect some arthritis flare. Advised f/u with pcp in 6 weeks if not improving

## 2020-11-25 NOTE — Progress Notes (Signed)
    I connected with John Barrett on 11/25/20 at 10:40 AM EST by video and verified that I am speaking with the correct person using two identifiers.   I discussed the limitations, risks, security and privacy concerns of performing an evaluation and management service by video and the availability of in person appointments. I also discussed with the patient that there may be a patient responsible charge related to this service. The patient expressed understanding and agreed to proceed.  Patient location: Home Provider Location: [place] Participants: Lesleigh Noe and John Barrett   Subjective:     John Barrett is a 70 y.o. male presenting for Referral (PT- Selinda Eon ) and Shoulder Pain (L only painful with certain movements x 4 days. Hx of bone spurs)     HPI   #Shouldr pain - top left shoulder - started Tuesday AM - has had bone spurs before and this feels like that - no pain at rest - only certain movements cause pain - putting on a coat - pulling up pants - had rehab in the past which resolved with PT for 15 years - would like referral to PT   No tingling, numbness, weakness   Review of Systems   Social History   Tobacco Use  Smoking Status Never Smoker  Smokeless Tobacco Never Used        Objective:   BP Readings from Last 3 Encounters:  03/03/20 (!) 96/58  07/12/19 118/76  06/26/19 103/66   Wt Readings from Last 3 Encounters:  03/03/20 168 lb (76.2 kg)  02/18/20 168 lb (76.2 kg)  07/12/19 163 lb 9.6 oz (74.2 kg)    There were no vitals taken for this visit.   Physical Exam Constitutional:      Appearance: Normal appearance. He is not ill-appearing.  HENT:     Head: Normocephalic and atraumatic.     Right Ear: External ear normal.     Left Ear: External ear normal.  Eyes:     Conjunctiva/sclera: Conjunctivae normal.  Pulmonary:     Effort: Pulmonary effort is normal. No respiratory distress.   Musculoskeletal:     Comments: Is able to raise arm above head and put behind back - though pain with internal rotation  Neurological:     Mental Status: He is alert. Mental status is at baseline.  Psychiatric:        Mood and Affect: Mood normal.        Behavior: Behavior normal.        Thought Content: Thought content normal.        Judgment: Judgment normal.            Assessment & Plan:   Problem List Items Addressed This Visit      Other   Chronic left shoulder pain - Primary    Pt with chronic left shoulder pain previously responded to PT and requests new referral. Referral placed. Suspect some arthritis flare. Advised f/u with pcp in 6 weeks if not improving      Relevant Orders   Ambulatory referral to Physical Therapy       Return if symptoms worsen or fail to improve.  Lesleigh Noe, MD

## 2020-12-11 ENCOUNTER — Ambulatory Visit (INDEPENDENT_AMBULATORY_CARE_PROVIDER_SITE_OTHER): Payer: Medicare Other | Admitting: Rehabilitative and Restorative Service Providers"

## 2020-12-11 ENCOUNTER — Other Ambulatory Visit: Payer: Self-pay

## 2020-12-11 DIAGNOSIS — R29898 Other symptoms and signs involving the musculoskeletal system: Secondary | ICD-10-CM | POA: Diagnosis not present

## 2020-12-11 DIAGNOSIS — M25512 Pain in left shoulder: Secondary | ICD-10-CM | POA: Diagnosis present

## 2020-12-11 NOTE — Therapy (Signed)
[place], [place], [address] Phone: [phone]   Fax:  [phone]  Physical Therapy Evaluation  Patient Details  Name: John Barrett MRN: [id number] Date of Birth: 1951-08-22 Referring Provider (PT): Waunita Schooner, MD   Encounter Date: 12/11/2020   PT End of Session - 12/11/20 1304    Visit Number 1    Number of Visits 12    Date for PT Re-Evaluation 01/22/21    Authorization Type Medicare and BCBS supplement    PT Start Time 0804    PT Stop Time 0845    PT Time Calculation (min) 41 min           Past Medical History:  Diagnosis Date  . Allergy    allergy eye drops prn  . Cellulitis    hospitalized 1995  . Chicken pox   . 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   . History of adenomatous polyp of colon    q5 years  . History of skin cancer    basal cell in past. Dr. Denna Haggard  arm, chest  . Psoriasis    has had to use orals in the past. Mainly controlled with triamcinolone per Dr. Denna Haggard  . Rosacea    hydrocortisone OTC per Dr. Denna Haggard  . Shingles    x2  . Soft tissue mass    left lower leg. removed by Dr. Harlow Asa  . 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 Squamous cell carcinoma of skin 05/10/2020   [facility] MID BACK CX3 5FU    Past Surgical History:  Procedure Laterality Date  . APPENDECTOMY    . COLONOSCOPY  12/2014   [facility] Plan - TA polyps   . HEMORRHOID BANDING  2008  . MASS EXCISION Left 07/29/2014   Procedure: EXCISION SOFT TISSUE MASS LEFT LOWER LEG;  Surgeon: Armandina Gemma, MD;  Location: [facility];  Service: General;  Laterality: Left;  . POLYPECTOMY    . TONSILLECTOMY AND ADENOIDECTOMY    . VEIN LIGATION     of penis. 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    Subjective Assessment - 12/11/20 0807    Subjective The patient reports 3  weeks ago, he went to stand up from the floor and got shooting pain in the L shoulder.  The pain has improved within the past 3-4 days.  Pain was shooting up to 7/10 with IR and horizontal abduction (putting on a coat), and it is now up to a 3-4/10.  Reaching into his pocket is also uncomfortable at times.    Patient Stated Goals My biggest concern is doing damage causing increased or long term pain; have a good sense of what is going on.    Currently in Pain? Yes    Pain Score 4     Pain Location Shoulder    Pain Orientation Left    Pain Descriptors / Indicators Sore    Pain Type Acute pain    Pain Onset 1 to 4 weeks ago    Pain Frequency Intermittent    Aggravating Factors  reaching behind him    Pain Relieving Factors rest              [facility] PT Assessment - 12/11/20 0811      Assessment   Medical Diagnosis L shoulder  pain    Referring Provider (PT) Waunita Schooner, MD    Onset Date/Surgical Date --   3 weeks ago   Hand Dominance Right    Prior Therapy prior therapy at another facility for the L shoulder      Precautions   Precautions None      Restrictions   Weight Bearing Restrictions No      Balance Screen   Has the patient fallen in the past 6 months No    Has the patient had a decrease in activity level because of a fear of falling?  No    Is the patient reluctant to leave their home because of a fear of falling?  No      Home Ecologist residence    Additional Comments independent with daily activities      Prior Function   Level of Independence Independent      Observation/Other Assessments   Focus on Therapeutic Outcomes (FOTO)  76% functional status score      Sensation   Light Touch Appears Intact      Posture/Postural Control   Posture/Postural Control No significant limitations      ROM / Strength   AROM / PROM / Strength AROM;Strength      AROM   Overall AROM  Deficits    AROM Assessment Site Shoulder    Right/Left  Shoulder Left;Right    Right Shoulder Internal Rotation 70 Degrees    Right Shoulder External Rotation 80 Degrees    Right Shoulder Horizontal ABduction 30 Degrees    Left Shoulder Internal Rotation 40 Degrees    Left Shoulder External Rotation 70 Degrees    Left Shoulder Horizontal ABduction 20 Degrees   with pain     Strength   Overall Strength Comments 5/5 throughout bilat shoulders for flexion, abduction, ER, IR, elbow flexion and extension      Flexibility   Soft Tissue Assessment /Muscle Length yes   tightness in pectoralis musculature     Palpation   Palpation comment tenderness to palpation in parascapular musculature      Special Tests    Special Tests Rotator Cuff Impingement    Rotator Cuff Impingment tests Lift- off test;Hawkins- Kennedy test;Painful Arc of Motion      Hawkins-Kennedy test   Findings Negative    Side Left      Lift-Off test   Findings Positive    Side Left    Comment mild discomfort with resistance      Painful Arc of Motion   Findings Negative    Side Left                      Objective measurements completed on examination: See above findings.       [facility] Adult PT Treatment/Exercise - 12/11/20 0811      Exercises   Exercises Shoulder      Shoulder Exercises: Standing   External Rotation Strengthening;Left;12 reps    Theraband Level (Shoulder External Rotation) Level 2 (Red)    Internal Rotation Strengthening;Left;12 reps    Theraband Level (Shoulder Internal Rotation) Level 2 (Red)      Shoulder Exercises: Stretch   Corner Stretch Limitations door frame stretch at 60 degrees and 120 degrees for anterior shoulder/ pectoralis stretch                  PT Education - 12/11/20 1303    Education Details HEP  Person(s) Educated Patient    Methods Explanation;Demonstration;Handout    Comprehension Verbalized understanding;Returned demonstration               PT Long Term Goals - 12/11/20 1305      PT  LONG TERM GOAL #1   Title The patient will be indep with HEP.    Time 6    Period Weeks    Target Date 01/22/21      PT LONG TERM GOAL #2   Title The patient will improve functional status score from 76% to > 80% to demo improving use of L upper extremity.    Time 6    Period Weeks    Target Date 01/22/21      PT LONG TERM GOAL #3   Title The patient will report no pain with horizontal abduction of the L shoulder during functional tasks (donning a jacket, shirt, or reaching behind him).    Time 6    Period Weeks    Target Date 01/22/21      PT LONG TERM GOAL #4   Title The patient will report return to prior functional status without limitations from L shoulder.    Time 6    Period Weeks    Target Date 01/22/21      PT LONG TERM GOAL #5   Title The patient will improve L shoulder IR to > or equal to 55 degrees.    Time 6    Period Weeks    Target Date 01/22/21                  Plan - 12/11/20 1310    Clinical Impression Statement The patient is a 70 year old male presenting to OP physical therapy with 3 week h/o L shoulder pain.  Pain is improving since onset.  He presents with impairments in AROM for IR/ER/horizontal abduction, soft tissue tightness, postural tightness and pain with end range movement.  PT to address deficits to return to prior functional status.    Examination-Activity Limitations Lift;Reach Overhead    Stability/Clinical Decision Making Stable/Uncomplicated    Clinical Decision Making Low    Rehab Potential Good    PT Frequency 2x / week    PT Duration 6 weeks    PT Treatment/Interventions ADLs/Self Care Home Management;Therapeutic activities;Therapeutic exercise;Electrical Stimulation;Iontophoresis 4mg /ml Dexamethasone;Moist Heat;Manual techniques;Dry needling;Taping;Patient/family education    PT Next Visit Plan Address soft tissue tightness parascapular region with STM/DN (doing soft tissue work at home with ball against wall lateral scapula),  shoulder strengthening, work on end range movement for IR/ER, and horizontal abduction.  Progress to tolerance.    PT Home Exercise Plan Access Code: [email or web address]    Consulted and Agree with Plan of Care Patient           Patient will benefit from skilled therapeutic intervention in order to improve the following deficits and impairments:  Pain,Increased fascial restricitons,Decreased range of motion,Postural dysfunction,Hypomobility,Impaired flexibility  Visit Diagnosis: Acute pain of left shoulder  Other symptoms and signs involving the musculoskeletal system     Problem List Patient Active Problem List   Diagnosis Date Noted  . Chronic left shoulder pain 11/25/2020  . Arthritis of left knee 12/18/2017  . Psoriasis   . BPH associated with nocturia 06/04/2017  . Allergy   . History of skin cancer   . Rosacea   . Grover's disease   . History of adenomatous polyp of colon   . Left knee pain 03/02/2013  .  RESTLESS LEGS SYNDROME 07/27/2009    Lemar Bakos, PT 12/11/2020, 1:15 PM  [facility] [place], [place], [address] Phone: [phone]   Fax:  [phone]  Name: Todd Jelinski MRN: [id number] Date of Birth: November 25, 1950

## 2020-12-11 NOTE — Patient Instructions (Signed)
Access Code: [email or web address] URL: [email or web address] Date: 12/11/2020 Prepared by: Rudell Cobb  Program Notes TENNIS BALL:  use a tennis ball against the wall on the side of your shoulder blade (left arm can be flexed forward) and roll over the ball to reduce muscle tightness x 2-3 minutes as tolerated.    Exercises Doorway Pec Stretch at 60 Elevation - 2 x daily - 7 x weekly - 1 sets - 3 reps - 30 seconds hold Doorway Pec Stretch at 120 Degrees Abduction - 2 x daily - 7 x weekly - 1 sets - 3 reps - 30 seconds hold Standing Shoulder Internal Rotation with Anchored Resistance - 2 x daily - 7 x weekly - 1 sets - 12 reps Shoulder External Rotation with Anchored Resistance - 2 x daily - 7 x weekly - 1 sets - 12 reps  Patient Education Trigger Point Dry Needling

## 2020-12-14 ENCOUNTER — Other Ambulatory Visit: Payer: Self-pay

## 2020-12-14 ENCOUNTER — Ambulatory Visit (INDEPENDENT_AMBULATORY_CARE_PROVIDER_SITE_OTHER): Payer: Medicare Other | Admitting: Rehabilitative and Restorative Service Providers"

## 2020-12-14 ENCOUNTER — Encounter: Payer: Self-pay | Admitting: Rehabilitative and Restorative Service Providers"

## 2020-12-14 DIAGNOSIS — R29898 Other symptoms and signs involving the musculoskeletal system: Secondary | ICD-10-CM | POA: Diagnosis not present

## 2020-12-14 DIAGNOSIS — M25512 Pain in left shoulder: Secondary | ICD-10-CM | POA: Diagnosis not present

## 2020-12-14 NOTE — Patient Instructions (Signed)
Access Code: [email or web address]: [email or web address]: 02/17/2022Prepared by: Israa Caban HoltProgram Notes TENNIS BALL: use a tennis ball against the wall on the side of your shoulder blade (left arm can be flexed forward) and roll over the ball to reduce muscle tightness x 2-3 minutes as tolerated.  Exercises  Doorway Pec Stretch at 60 Elevation - 2 x daily - 7 x weekly - 1 sets - 3 reps - 30 seconds hold  Doorway Pec Stretch at 120 Degrees Abduction - 2 x daily - 7 x weekly - 1 sets - 3 reps - 30 seconds hold  Standing Shoulder Internal Rotation with Anchored Resistance - 2 x daily - 7 x weekly - 1 sets - 12 reps  Shoulder External Rotation with Anchored Resistance - 2 x daily - 7 x weekly - 1 sets - 12 reps  Standing Scapular Retraction - 2 x daily - 7 x weekly - 1 sets - 10 reps - 10 sec hold  Standing shoulder flexion wall slides - 2 x daily - 7 x weekly - 1 sets - 3 reps - 30 sec hold  Standing Plank on Wall - 2 x daily - 7 x weekly - 1 sets - 3 reps - 30 sec hold  Wall Push Up - 2 x daily - 7 x weekly - 1 sets - 3 reps - 30 sec hold  Standing Bilateral Low Shoulder Row with Anchored Resistance - 2 x daily - 7 x weekly - 1-3 sets - 10 reps - 2-3 sec hold

## 2020-12-14 NOTE — Therapy (Signed)
[place], [place], [address] Phone: [phone]   Fax:  [phone]  Physical Therapy Treatment  Patient Details  Name: Sabastien Barrett MRN: [id number] Date of Birth: 04/04/1951 Referring Provider (PT): Waunita Schooner, MD   Encounter Date: 12/14/2020   PT End of Session - 12/14/20 1654    Visit Number 2    Number of Visits 12    Date for PT Re-Evaluation 01/22/21    Authorization Type Medicare and BCBS supplement    PT Start Time 5038    PT Stop Time 1748    PT Time Calculation (min) 54 min    Activity Tolerance Patient tolerated treatment well           Past Medical History:  Diagnosis Date  . Allergy    allergy eye drops prn  . Cellulitis    hospitalized 1995  . Chicken pox   . 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   . History of adenomatous polyp of colon    q5 years  . History of skin cancer    basal cell in past. Dr. Denna Haggard  arm, chest  . Psoriasis    has had to use orals in the past. Mainly controlled with triamcinolone per Dr. Denna Haggard  . Rosacea    hydrocortisone OTC per Dr. Denna Haggard  . Shingles    x2  . Soft tissue mass    left lower leg. removed by Dr. Harlow Asa  . 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 Squamous cell carcinoma of skin 05/10/2020   [facility] MID BACK CX3 5FU    Past Surgical History:  Procedure Laterality Date  . APPENDECTOMY    . COLONOSCOPY  12/2014   [facility] Plan - TA polyps   . HEMORRHOID BANDING  2008  . MASS EXCISION Left 07/29/2014   Procedure: EXCISION SOFT TISSUE MASS LEFT LOWER LEG;  Surgeon: Armandina Gemma, MD;  Location: [facility];  Service: General;  Laterality: Left;  . POLYPECTOMY    . TONSILLECTOMY AND ADENOIDECTOMY    . VEIN LIGATION     of penis. 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   Subjective Assessment -  12/14/20 1655    Subjective Patient reports that he was sore and had more pain in the Lt shoulder yesterday but it feels some better today. He has been working on his exercises at home.    Currently in Pain? Yes    Pain Score 0-No pain    Pain Location Shoulder    Pain Orientation Left    Pain Type Acute pain    Pain Onset 1 to 4 weeks ago    Pain Frequency Intermittent                             [facility] Adult PT Treatment/Exercise - 12/14/20 0001      Shoulder Exercises: Supine   Other Supine Exercises prolonged snow angel ~ 2 min; hands behind head ~ 1 min    Other Supine Exercises trunk rotation to each side 20 sec hold x 3 reps      Shoulder Exercises: Standing   External Rotation Strengthening;Left;12 reps    Theraband Level (Shoulder External Rotation) Level 3 (Green)    Internal Rotation Strengthening;Left;12 reps  Theraband Level (Shoulder Internal Rotation) Level 3 (Green)    Row Strengthening;Both;10 reps;Theraband    Theraband Level (Shoulder Row) Level 2 (Red)    Row Limitations bow and arrow x 10 red TB    Retraction AROM;Both;10 reps   10 sec hold; noodlee along spine   Other Standing Exercises reverse wall push up x 10    Other Standing Exercises counter plank with alternating shoulder extension x 5 each      Shoulder Exercises: Stretch   Corner Stretch Limitations door frame stretch at 60 degrees and 120 degrees for anterior shoulder/ pectoralis stretch    Other Shoulder Stretches shoulder flexion stretch stepping under the doorway 30 sec x 2 reps    Other Shoulder Stretches horizontal abduction stretch at wall 30 sec x 2 reps      Cryotherapy   Number Minutes Cryotherapy 8 Minutes    Cryotherapy Location Shoulder   Lt   Type of Cryotherapy Ice pack      Manual Therapy   Joint Mobilization Lt GH joint multiple planes    Soft tissue mobilization deep tissue work through Rt shoulder girdle    Myofascial Release anterior chest/pecs    Other  Manual Therapy PROM stretching into end ranges                  PT Education - 12/14/20 1725    Education Details HEP    Methods Explanation;Demonstration;Tactile cues;Verbal cues;Handout    Comprehension Verbalized understanding;Returned demonstration;Verbal cues required;Tactile cues required               PT Long Term Goals - 12/11/20 1305      PT LONG TERM GOAL #1   Title The patient will be indep with HEP.    Time 6    Period Weeks    Target Date 01/22/21      PT LONG TERM GOAL #2   Title The patient will improve functional status score from 76% to > 80% to demo improving use of L upper extremity.    Time 6    Period Weeks    Target Date 01/22/21      PT LONG TERM GOAL #3   Title The patient will report no pain with horizontal abduction of the L shoulder during functional tasks (donning a jacket, shirt, or reaching behind him).    Time 6    Period Weeks    Target Date 01/22/21      PT LONG TERM GOAL #4   Title The patient will report return to prior functional status without limitations from L shoulder.    Time 6    Period Weeks    Target Date 01/22/21      PT LONG TERM GOAL #5   Title The patient will improve L shoulder IR to > or equal to 55 degrees.    Time 6    Period Weeks    Target Date 01/22/21                 Plan - 12/14/20 1705    Clinical Impression Statement Good response to initial treatment with some soreness or pain yesterday but no pain today. Feels much looser following treatment today.    Rehab Potential Good    PT Frequency 2x / week    PT Duration 6 weeks    PT Treatment/Interventions ADLs/Self Care Home Management;Therapeutic activities;Therapeutic exercise;Electrical Stimulation;Iontophoresis 4mg /ml Dexamethasone;Moist Heat;Manual techniques;Dry needling;Taping;Patient/family education    PT Next Visit Plan assess response  to soft tissue tightness parascapular region with STM (doing soft tissue work at home with ball  against wall lateral scapula), shoulder strengthening, work on end range movement for IR/ER, and horizontal abduction. Trial of DN as indicated  Progress to tolerance.    PT Home Exercise Plan Access Code: [email or web address]    Consulted and Agree with Plan of Care Patient           Patient will benefit from skilled therapeutic intervention in order to improve the following deficits and impairments:     Visit Diagnosis: Acute pain of left shoulder  Other symptoms and signs involving the musculoskeletal system     Problem List Patient Active Problem List   Diagnosis Date Noted  . Chronic left shoulder pain 11/25/2020  . Arthritis of left knee 12/18/2017  . Psoriasis   . BPH associated with nocturia 06/04/2017  . Allergy   . History of skin cancer   . Rosacea   . Grover's disease   . History of adenomatous polyp of colon   . Left knee pain 03/02/2013  . RESTLESS LEGS SYNDROME 07/27/2009    Cressida Milford Nilda Simmer PT, MPH 12/14/2020, 5:56 PM  [facility] [place], [place], [address] Phone: [phone]   Fax:  [phone]  Name: John Barrett MRN: [id number] Date of Birth: Jan 30, 1951

## 2020-12-20 ENCOUNTER — Encounter: Payer: Self-pay | Admitting: Rehabilitative and Restorative Service Providers"

## 2020-12-20 ENCOUNTER — Other Ambulatory Visit: Payer: Self-pay

## 2020-12-20 ENCOUNTER — Ambulatory Visit (INDEPENDENT_AMBULATORY_CARE_PROVIDER_SITE_OTHER): Payer: Medicare Other | Admitting: Rehabilitative and Restorative Service Providers"

## 2020-12-20 DIAGNOSIS — M25512 Pain in left shoulder: Secondary | ICD-10-CM | POA: Diagnosis not present

## 2020-12-20 DIAGNOSIS — R29898 Other symptoms and signs involving the musculoskeletal system: Secondary | ICD-10-CM

## 2020-12-20 NOTE — Therapy (Signed)
[place], [place], [address] Phone: [phone]   Fax:  [phone]  Physical Therapy Treatment  Patient Details  Name: John Barrett MRN: [id number] Date of Birth: Mar 11, 1951 Referring Provider (PT): Waunita Schooner, MD   Encounter Date: 12/20/2020   PT End of Session - 12/20/20 0758    Visit Number 3    Number of Visits 12    Date for PT Re-Evaluation 01/22/21    Authorization Type Medicare and BCBS supplement    PT Start Time 2993    PT Stop Time 0843    PT Time Calculation (min) 46 min    Activity Tolerance Patient tolerated treatment well           Past Medical History:  Diagnosis Date  . Allergy    allergy eye drops prn  . Cellulitis    hospitalized 1995  . Chicken pox   . 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   . History of adenomatous polyp of colon    q5 years  . History of skin cancer    basal cell in past. Dr. Denna Haggard  arm, chest  . Psoriasis    has had to use orals in the past. Mainly controlled with triamcinolone per Dr. Denna Haggard  . Rosacea    hydrocortisone OTC per Dr. Denna Haggard  . Shingles    x2  . Soft tissue mass    left lower leg. removed by Dr. Harlow Asa  . 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 Squamous cell carcinoma of skin 05/10/2020   [facility] MID BACK CX3 5FU    Past Surgical History:  Procedure Laterality Date  . APPENDECTOMY    . COLONOSCOPY  12/2014   [facility] Plan - TA polyps   . HEMORRHOID BANDING  2008  . MASS EXCISION Left 07/29/2014   Procedure: EXCISION SOFT TISSUE MASS LEFT LOWER LEG;  Surgeon: Armandina Gemma, MD;  Location: [facility];  Service: General;  Laterality: Left;  . POLYPECTOMY    . TONSILLECTOMY AND ADENOIDECTOMY    . VEIN LIGATION     of penis. 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   Subjective Assessment -  12/20/20 0800    Subjective Some soreness yesterday and today which he thinks is from the exercises he is doing at home.    Currently in Pain? No/denies    Pain Score 0-No pain    Pain Location Shoulder    Pain Orientation Left    Pain Descriptors / Indicators Sore    Pain Type Acute pain                             [facility] Adult PT Treatment/Exercise - 12/20/20 0001      Therapeutic Activites    Therapeutic Activities --   myofacial ball relesae work     Shoulder Exercises: Supine   Other Supine Exercises prolonged snow angel ~ 2 min; hands behind head ~ 1 min   on noodle   Other Supine Exercises trunk rotation to each side 20 sec hold x 3 reps      Shoulder Exercises: Standing   Extension Strengthening;Both;10 reps;Theraband    Theraband Level (Shoulder Extension) Level 3 (Green)    Row Strengthening;Both;10 reps;Theraband    Theraband  Level (Shoulder Row) Level 3 (Green)    Row Limitations bow and arrow x 10 red TB    Retraction AROM;Both;10 reps   10 sec hold; noodlee along spine   Shoulder Elevation Strengthening;Both;10 reps   red TB at wrist - pushing arms up wall elbows stay in   Other Standing Exercises reverse wall push up x 10    Other Standing Exercises counter plank with alternating shoulder extension x 5 each      Shoulder Exercises: ROM/Strengthening   UBE (Upper Arm Bike) L3 x 4 min alt fwd/back      Shoulder Exercises: Stretch   Corner Stretch Limitations three way doorway 3 positions; 30 sec hold x 3 reps    Other Shoulder Stretches shoulder flexion stretch stepping under the doorway 30 sec x 2 reps    Other Shoulder Stretches horizontal abduction stretch at wall 30 sec x 2 reps      Cryotherapy   Number Minutes Cryotherapy 5 Minutes    Cryotherapy Location Shoulder   Lt   Type of Cryotherapy Ice pack      Manual Therapy   Manual therapy comments pt supine    Joint Mobilization Lt GH joint multiple planes    Soft tissue mobilization  deep tissue work through Rt shoulder girdle    Myofascial Release anterior chest/pecs    Other Manual Therapy PROM stretching into end ranges                  PT Education - 12/20/20 0827    Education Details HEP    Person(s) Educated Patient    Methods Explanation;Demonstration;Tactile cues;Verbal cues;Handout    Comprehension Verbalized understanding;Returned demonstration;Verbal cues required;Tactile cues required               PT Long Term Goals - 12/11/20 1305      PT LONG TERM GOAL #1   Title The patient will be indep with HEP.    Time 6    Period Weeks    Target Date 01/22/21      PT LONG TERM GOAL #2   Title The patient will improve functional status score from 76% to > 80% to demo improving use of L upper extremity.    Time 6    Period Weeks    Target Date 01/22/21      PT LONG TERM GOAL #3   Title The patient will report no pain with horizontal abduction of the L shoulder during functional tasks (donning a jacket, shirt, or reaching behind him).    Time 6    Period Weeks    Target Date 01/22/21      PT LONG TERM GOAL #4   Title The patient will report return to prior functional status without limitations from L shoulder.    Time 6    Period Weeks    Target Date 01/22/21      PT LONG TERM GOAL #5   Title The patient will improve L shoulder IR to > or equal to 55 degrees.    Time 6    Period Weeks    Target Date 01/22/21                 Plan - 12/20/20 0801    Clinical Impression Statement Continued progress with no pain just soreness. Has been working on his exercises at home. Added strengthening and stabilization exercises without difficulty.    Rehab Potential Good    PT Frequency 2x / week  PT Duration 6 weeks    PT Treatment/Interventions ADLs/Self Care Home Management;Therapeutic activities;Therapeutic exercise;Electrical Stimulation;Iontophoresis 4mg /ml Dexamethasone;Moist Heat;Manual techniques;Dry  needling;Taping;Patient/family education    PT Next Visit Plan continue soft tissue tightness parascapular region with STM (doing soft tissue work at home with ball against wall lateral scapula), shoulder strengthening, work on end range movement for IR/ER, and horizontal abduction. Trial of DN as indicated  Progress to tolerance.    PT Home Exercise Plan [phone]    Consulted and Agree with Plan of Care Patient           Patient will benefit from skilled therapeutic intervention in order to improve the following deficits and impairments:     Visit Diagnosis: Acute pain of left shoulder  Other symptoms and signs involving the musculoskeletal system     Problem List Patient Active Problem List   Diagnosis Date Noted  . Chronic left shoulder pain 11/25/2020  . Arthritis of left knee 12/18/2017  . Psoriasis   . BPH associated with nocturia 06/04/2017  . Allergy   . History of skin cancer   . Rosacea   . Grover's disease   . History of adenomatous polyp of colon   . Left knee pain 03/02/2013  . RESTLESS LEGS SYNDROME 07/27/2009    Montie Swiderski Nilda Simmer PT, MPH  12/20/2020, 8:45 AM  [facility] [place], [place], [address] Phone: [phone]   Fax:  [phone]  Name: John Barrett MRN: [id number] Date of Birth: 1951-07-10

## 2020-12-20 NOTE — Patient Instructions (Signed)
Access Code: [email or web address]: [email or web address]: 02/23/2022Prepared by: Taraoluwa Thakur HoltProgram Notes TENNIS BALL: use a tennis ball against the wall on the side of your shoulder blade (left arm can be flexed forward) and roll over the ball to reduce muscle tightness x 2-3 minutes as tolerated.  Exercises  Doorway Pec Stretch at 60 Elevation - 2 x daily - 7 x weekly - 1 sets - 3 reps - 30 seconds hold  Doorway Pec Stretch at 120 Degrees Abduction - 2 x daily - 7 x weekly - 1 sets - 3 reps - 30 seconds hold  Standing Shoulder Internal Rotation with Anchored Resistance - 2 x daily - 7 x weekly - 1 sets - 12 reps  Shoulder External Rotation with Anchored Resistance - 2 x daily - 7 x weekly - 1 sets - 12 reps  Standing Scapular Retraction - 2 x daily - 7 x weekly - 1 sets - 10 reps - 10 sec hold  Standing shoulder flexion wall slides - 2 x daily - 7 x weekly - 1 sets - 3 reps - 30 sec hold  Standing Plank on Wall - 2 x daily - 7 x weekly - 1 sets - 3 reps - 30 sec hold  Wall Push Up - 2 x daily - 7 x weekly - 1 sets - 3 reps - 30 sec hold  Standing Bilateral Low Shoulder Row with Anchored Resistance - 2 x daily - 7 x weekly - 1-3 sets - 10 reps - 2-3 sec hold  Shoulder Flexion Serratus Activation with Resistance - 2 x daily - 7 x weekly - 1 sets - 10 reps - 2-3 sec hold  Shoulder Extension with Resistance - 2 x daily - 7 x weekly - 1-3 sets - 10 reps - 2-3 sec hold  Bird Dog - 2 x daily - 7 x weekly - 1 sets - 10 reps - 2-3 sec hold  Supine Shoulder External Rotation in Abduction - 2 x daily - 7 x weekly - 1 sets - 3 reps - 30 sec hold  Supine Shoulder External Rotation in Abduction - 2 x daily - 7 x weekly - 1 sets - 1-2 reps - 2-5 min hold

## 2020-12-22 ENCOUNTER — Other Ambulatory Visit: Payer: Self-pay

## 2020-12-22 ENCOUNTER — Encounter: Payer: Self-pay | Admitting: Rehabilitative and Restorative Service Providers"

## 2020-12-22 ENCOUNTER — Ambulatory Visit (INDEPENDENT_AMBULATORY_CARE_PROVIDER_SITE_OTHER): Payer: Medicare Other | Admitting: Rehabilitative and Restorative Service Providers"

## 2020-12-22 DIAGNOSIS — M25512 Pain in left shoulder: Secondary | ICD-10-CM

## 2020-12-22 DIAGNOSIS — R29898 Other symptoms and signs involving the musculoskeletal system: Secondary | ICD-10-CM

## 2020-12-22 NOTE — Therapy (Signed)
[place], [place], [address] Phone: [phone]   Fax:  [phone]  Physical Therapy Treatment  Patient Details  Name: John Barrett MRN: [id number] Date of Birth: 09-04-1951 Referring Provider (PT): Waunita Schooner, MD   Encounter Date: 12/22/2020   PT End of Session - 12/22/20 1514    Visit Number 4    Number of Visits 12    Date for PT Re-Evaluation 01/22/21    Authorization Type Medicare and BCBS supplement    PT Start Time 0930    PT Stop Time 1016    PT Time Calculation (min) 46 min    Activity Tolerance Patient tolerated treatment well           Past Medical History:  Diagnosis Date  . Allergy    allergy eye drops prn  . Cellulitis    hospitalized 1995  . Chicken pox   . 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   . History of adenomatous polyp of colon    q5 years  . History of skin cancer    basal cell in past. Dr. Denna Haggard  arm, chest  . Psoriasis    has had to use orals in the past. Mainly controlled with triamcinolone per Dr. Denna Haggard  . Rosacea    hydrocortisone OTC per Dr. Denna Haggard  . Shingles    x2  . Soft tissue mass    left lower leg. removed by Dr. Harlow Asa  . 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 Squamous cell carcinoma of skin 05/10/2020   [facility]CC MID BACK CX3 5FU    Past Surgical History:  Procedure Laterality Date  . APPENDECTOMY    . COLONOSCOPY  12/2014   [facility] Plan - TA polyps   . HEMORRHOID BANDING  2008  . MASS EXCISION Left 07/29/2014   Procedure: EXCISION SOFT TISSUE MASS LEFT LOWER LEG;  Surgeon: Armandina Gemma, MD;  Location: [facility];  Service: General;  Laterality: Left;  . POLYPECTOMY    . TONSILLECTOMY AND ADENOIDECTOMY    . VEIN LIGATION     of penis. 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   Subjective Assessment -  12/22/20 0928    Subjective The patient is reports some "soreness" in anterior left shoulder, but no pain unless he perform shoulder extension (he can do shoulder horizontal abduction without pain)    Patient Stated Goals My biggest concern is doing damage causing increased or long term pain; have a good sense of what is going on.    Currently in Pain? Yes    Pain Score 3     Pain Location Shoulder    Pain Orientation Left    Pain Descriptors / Indicators Sore    Pain Type Chronic pain    Pain Onset 1 to 4 weeks ago    Pain Frequency Intermittent    Aggravating Factors  shoulder extension    Pain Relieving Factors rest              [facility] PT Assessment - 12/22/20 0934      Assessment   Medical Diagnosis L shoulder pain    Referring Provider (PT) Waunita Schooner, MD    Hand Dominance Right  [facility] Adult PT Treatment/Exercise - 12/22/20 0935      Exercises   Exercises Shoulder      Shoulder Exercises: Prone   Retraction Strengthening;Both;10 reps    Flexion Strengthening;Both;10 reps    Flexion Limitations superman bilat UE lift x 10 reps    Extension Strengthening;Both;10 reps    Horizontal ABduction 1 Strengthening;Both;10 reps    Horizontal ABduction 1 Weight (lbs) 2 lbs    Other Prone Exercises plank on elbows (8 point plank) and then lifting knees to standard elbow plank    Other Prone Exercises quadriped bird dog x 5 reps each side      Shoulder Exercises: Standing   Extension Strengthening;Both;10 reps    Extension Weight (lbs) 2 lbs    Extension Limitations in pain free range    Row Strengthening;Both;10 reps;Theraband    Theraband Level (Shoulder Row) Level 2 (Red)    Row Limitations pull downs for lat engagement with elbow extension    Other Standing Exercises --      Shoulder Exercises: ROM/Strengthening   UBE (Upper Arm Bike) L3 x 2 minutes forward/1 minute backwards      Shoulder Exercises: Stretch   Wall Stretch -  ABduction 2 reps;30 seconds      Manual Therapy   Manual Therapy Soft tissue mobilization;Joint mobilization;Myofascial release    Manual therapy comments skilled palpation to assess response to DN and STM    Joint Mobilization L AC joint AP grade II-III    Soft tissue mobilization deep tissue work through [employer], R anterior shoulder, parascapular mobilization    Myofascial Release anterior chest/pecs            Trigger Point Dry Needling - 12/22/20 1519    Consent Given? Yes    Education Handout Provided No   to provide next session   Muscles Treated Upper Quadrant Pectoralis major;Biceps    Dry Needling Comments left side    Pectoralis Major Response Palpable increased muscle length    Biceps Response Palpable increased muscle length                     PT Long Term Goals - 12/22/20 1514      PT LONG TERM GOAL #1   Title The patient will be indep with HEP.    Time 6    Period Weeks      PT LONG TERM GOAL #2   Title The patient will improve functional status score from 76% to > 80% to demo improving use of L upper extremity.    Time 6    Period Weeks      PT LONG TERM GOAL #3   Title The patient will report no pain with horizontal abduction of the L shoulder during functional tasks (donning a jacket, shirt, or reaching behind him).    Baseline Horizontal abduction no longer painful, however shoulder extension can still provoke anterior shoulder pain    Time 6    Period Weeks    Status Achieved      PT LONG TERM GOAL #4   Title The patient will report return to prior functional status without limitations from L shoulder.    Time 6    Period Weeks      PT LONG TERM GOAL #5   Title The patient will improve L shoulder IR to > or equal to 55 degrees.    Time 6    Period Weeks  Plan - 12/22/20 1519    Clinical Impression Statement The patient met one LTG today demonstrating horizontal abduction without pain.  He has soft tissue  tightness and pain in L pec and bicipital groove.  PT addressed today with soft tissue work (instrument assisted, and dry needling) to gain greater length in musculature.  Patient had increased shoulder extensor ROM after soft tissue work.  PT working to continue progression to return to prior functional status.    PT Treatment/Interventions ADLs/Self Care Home Management;Therapeutic activities;Therapeutic exercise;Electrical Stimulation;Iontophoresis 4m/ml Dexamethasone;Moist Heat;Manual techniques;Dry needling;Taping;Patient/family education    PT Next Visit Plan continue soft tissue tightness parascapular region with STM (pec and biceps too), shoulder strengthening, work on end range movement for IR/ER, and horizontal abduction. Trial of DN as indicated  Progress to tolerance.    PT Home Exercise Plan [id number]   Consulted and Agree with Plan of Care Patient           Patient will benefit from skilled therapeutic intervention in order to improve the following deficits and impairments:     Visit Diagnosis: Other symptoms and signs involving the musculoskeletal system  Acute pain of left shoulder     Problem List Patient Active Problem List   Diagnosis Date Noted  . Chronic left shoulder pain 11/25/2020  . Arthritis of left knee 12/18/2017  . Psoriasis   . BPH associated with nocturia 06/04/2017  . Allergy   . History of skin cancer   . Rosacea   . Grover's disease   . History of adenomatous polyp of colon   . Left knee pain 03/02/2013  . RESTLESS LEGS SYNDROME 07/27/2009    Hesham Womac, PT 12/22/2020, 3:23 PM  [facility] [address]Phone: [phone]  Fax:  [phone] Name: John MongerMRN: [id number] of Birth: [birth year]

## 2020-12-25 ENCOUNTER — Encounter: Payer: Self-pay | Admitting: Rehabilitative and Restorative Service Providers"

## 2020-12-25 ENCOUNTER — Other Ambulatory Visit: Payer: Self-pay

## 2020-12-25 ENCOUNTER — Ambulatory Visit (INDEPENDENT_AMBULATORY_CARE_PROVIDER_SITE_OTHER): Payer: Medicare Other | Admitting: Rehabilitative and Restorative Service Providers"

## 2020-12-25 DIAGNOSIS — R29898 Other symptoms and signs involving the musculoskeletal system: Secondary | ICD-10-CM | POA: Diagnosis not present

## 2020-12-25 DIAGNOSIS — M25512 Pain in left shoulder: Secondary | ICD-10-CM

## 2020-12-25 NOTE — Patient Instructions (Signed)
Access Code: [email or web address] URL: [email or web address] Date: 12/25/2020 Prepared by: Rudell Cobb  Program Notes TENNIS BALL:  use a tennis ball against the wall on the side of your shoulder blade (left arm can be flexed forward) and roll over the ball to reduce muscle tightness x 2-3 minutes as tolerated.    Exercises Doorway Pec Stretch at 60 Elevation - 2 x daily - 7 x weekly - 1 sets - 3 reps - 30 seconds hold Doorway Pec Stretch at 120 Degrees Abduction - 2 x daily - 7 x weekly - 1 sets - 3 reps - 30 seconds hold Standing Shoulder Internal Rotation with Anchored Resistance - 2 x daily - 7 x weekly - 1 sets - 12 reps Shoulder External Rotation with Anchored Resistance - 2 x daily - 7 x weekly - 1 sets - 12 reps Standing Shoulder Horizontal Abduction with Resistance - 2 x daily - 7 x weekly - 1 sets - 10 reps Standing Bilateral Low Shoulder Row with Anchored Resistance - 2 x daily - 7 x weekly - 1-3 sets - 10 reps - 2-3 sec hold Shoulder Flexion Serratus Activation with Resistance - 2 x daily - 7 x weekly - 1 sets - 10 reps - 2-3 sec hold Shoulder Extension with Resistance - 2 x daily - 7 x weekly - 1-3 sets - 10 reps - 2-3 sec hold Wall Push Up - 2 x daily - 7 x weekly - 1 sets - 3 reps - 30 sec hold Bird Dog - 2 x daily - 7 x weekly - 1 sets - 10 reps - 2-3 sec hold Supine Shoulder External Rotation in Abduction - 2 x daily - 7 x weekly - 1 sets - 3 reps - 30 sec hold Supine Shoulder External Rotation in Abduction - 2 x daily - 7 x weekly - 1 sets - 1-2 reps - 2-5 min hold

## 2020-12-25 NOTE — Therapy (Signed)
[place], [place], [address] Phone: [phone]   Fax:  [phone]  Physical Therapy Treatment  Patient Details  Name: John Barrett MRN: [id number] Date of Birth: 1950/12/14 Referring Provider (PT): Waunita Schooner, MD   Encounter Date: 12/25/2020   PT End of Session - 12/25/20 0803    Visit Number 5    Number of Visits 12    Date for PT Re-Evaluation 01/22/21    Authorization Type Medicare and BCBS supplement    PT Start Time 0800    PT Stop Time 0843    PT Time Calculation (min) 43 min    Activity Tolerance Patient tolerated treatment well    Behavior During Therapy [facility] for tasks assessed/performed           Past Medical History:  Diagnosis Date  . Allergy    allergy eye drops prn  . Cellulitis    hospitalized 1995  . Chicken pox   . 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   . History of adenomatous polyp of colon    q5 years  . History of skin cancer    basal cell in past. Dr. Denna Haggard  arm, chest  . Psoriasis    has had to use orals in the past. Mainly controlled with triamcinolone per Dr. Denna Haggard  . Rosacea    hydrocortisone OTC per Dr. Denna Haggard  . Shingles    x2  . Soft tissue mass    left lower leg. removed by Dr. Harlow Asa  . 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 Squamous cell carcinoma of skin 05/10/2020   SCC MID BACK CX3 5FU    Past Surgical History:  Procedure Laterality Date  . APPENDECTOMY    . COLONOSCOPY  12/2014   Fuller Plan - TA polyps   . HEMORRHOID BANDING  2008  . MASS EXCISION Left 07/29/2014   Procedure: EXCISION SOFT TISSUE MASS LEFT LOWER LEG;  Surgeon: Armandina Gemma, MD;  Location: [place];  Service: General;  Laterality: Left;  . POLYPECTOMY    . TONSILLECTOMY AND ADENOIDECTOMY    . VEIN LIGATION     of penis. 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   Subjective Assessment - 12/25/20 0800    Subjective The patient feels looser anteriorly in his shoulder, but notes a deep soreness in the shoulder joint.  The patient feels he is gaining ROM in the L shoulder.    Patient Stated Goals My biggest concern is doing damage causing increased or long term pain; have a good sense of what is going on.    Currently in Pain? Yes    Pain Score 3     Pain Location Shoulder    Pain Orientation Left    Pain Descriptors / Indicators Sore    Pain Type Chronic pain    Pain Onset 1 to 4 weeks ago    Pain Frequency Intermittent    Aggravating Factors  shoulder extension    Pain Relieving Factors rest              [facility] PT Assessment - 12/25/20 0804      Assessment   Medical Diagnosis L shoulder pain    Referring Provider (PT) Waunita Schooner, MD    Hand Dominance Right  [place] Adult PT Treatment/Exercise - 12/25/20 0804      Exercises   Exercises Shoulder      Shoulder Exercises: Supine   External Rotation Strengthening;Left;10 reps    External Rotation Weight (lbs) 2    Internal Rotation Strengthening;Left;10 reps    Internal Rotation Weight (lbs) 2    Diagonals Strengthening;Left;10 reps    Diagonals Weight (lbs) 2      Shoulder Exercises: Standing   Retraction Strengthening;Both;12 reps    Theraband Level (Shoulder Retraction) Level 2 (Red)    Retraction Limitations also performed diagonally    Diagonals AROM;Left;5 reps    Diagonals Limitations some soreness with multi-plane motion      Shoulder Exercises: ROM/Strengthening   UBE (Upper Arm Bike) L4 x 2 minutes forward/1 minute backwards    Rhythmic Stabilization, Supine multiplanar rhythmic stabilization without difficulty      Shoulder Exercises: Stretch   Corner Stretch Limitations door frame stretch in  W and V positions    Other Shoulder Stretches passive overpressure for abduction/flexion to end  range with pec release      Manual Therapy   Manual Therapy Joint mobilization;Soft tissue mobilization    Manual therapy comments skilled palpation to assess response to DN and STM, also to reduce end range pain    Joint Mobilization L superior<>inferior GH glide x grade II, distraction    Soft tissue mobilization STM and IASTM L pecs, L deltoid and biceps    Myofascial Release pec release    Other Manual Therapy PROM stretching into end ranges            Trigger Point Dry Needling - 12/25/20 1231    Consent Given? Yes    Education Handout Provided No    Muscles Treated Upper Quadrant Pectoralis major    Dry Needling Comments left side    Pectoralis Major Response Palpable increased muscle length;Twitch response elicited                PT Education - 12/25/20 0846    Education Details HEP    Person(s) Educated Patient    Methods Explanation;Demonstration;Handout    Comprehension Verbalized understanding;Returned demonstration               PT Long Term Goals - 12/22/20 1514      PT LONG TERM GOAL #1   Title The patient will be indep with HEP.    Time 6    Period Weeks      PT LONG TERM GOAL #2   Title The patient will improve functional status score from 76% to > 80% to demo improving use of L upper extremity.    Time 6    Period Weeks      PT LONG TERM GOAL #3   Title The patient will report no pain with horizontal abduction of the L shoulder during functional tasks (donning a jacket, shirt, or reaching behind him).    Baseline Horizontal abduction no longer painful, however shoulder extension can still provoke anterior shoulder pain    Time 6    Period Weeks    Status Achieved      PT LONG TERM GOAL #4   Title The patient will report return to prior functional status without limitations from L shoulder.    Time 6    Period Weeks      PT LONG TERM GOAL #5   Title The patient will improve L shoulder IR to > or equal to 55 degrees.  Time 6     Period Weeks                 Plan - 12/25/20 4008    Clinical Impression Statement The patient is pain free at end of session with motion.  When he arrived, he noted pain with end range extension and shoulder diagonal behind frontal plane.  PT to continue working on STM/DN of pectoralis and then progressing with scapular strengthening.    PT Treatment/Interventions ADLs/Self Care Home Management;Therapeutic activities;Therapeutic exercise;Electrical Stimulation;Iontophoresis 4mg /ml Dexamethasone;Moist Heat;Manual techniques;Dry needling;Taping;Patient/family education    PT Next Visit Plan continue soft tissue tightness parascapular region with STM (pec and biceps too), shoulder strengthening, work on end range movement for IR/ER, and horizontal abduction. Trial of DN as indicated  Progress to tolerance.    PT Home Exercise Plan [phone]    Consulted and Agree with Plan of Care Patient           Patient will benefit from skilled therapeutic intervention in order to improve the following deficits and impairments:     Visit Diagnosis: Other symptoms and signs involving the musculoskeletal system  Acute pain of left shoulder     Problem List Patient Active Problem List   Diagnosis Date Noted  . Chronic left shoulder pain 11/25/2020  . Arthritis of left knee 12/18/2017  . Psoriasis   . BPH associated with nocturia 06/04/2017  . Allergy   . History of skin cancer   . Rosacea   . Grover's disease   . History of adenomatous polyp of colon   . Left knee pain 03/02/2013  . RESTLESS LEGS SYNDROME 07/27/2009    Sagrario Lineberry, PT 12/25/2020, 12:32 PM  [facility] [place], [place], [address] Phone: [phone]   Fax:  [phone]  Name: John Barrett MRN: [id number] Date of Birth: [birth year]

## 2020-12-27 ENCOUNTER — Ambulatory Visit (INDEPENDENT_AMBULATORY_CARE_PROVIDER_SITE_OTHER): Payer: Medicare Other | Admitting: Dermatology

## 2020-12-27 ENCOUNTER — Other Ambulatory Visit: Payer: Self-pay

## 2020-12-27 ENCOUNTER — Encounter: Payer: Self-pay | Admitting: Dermatology

## 2020-12-27 DIAGNOSIS — Z85828 Personal history of other malignant neoplasm of skin: Secondary | ICD-10-CM

## 2020-12-27 DIAGNOSIS — L820 Inflamed seborrheic keratosis: Secondary | ICD-10-CM | POA: Diagnosis not present

## 2020-12-27 DIAGNOSIS — B078 Other viral warts: Secondary | ICD-10-CM

## 2020-12-27 DIAGNOSIS — D485 Neoplasm of uncertain behavior of skin: Secondary | ICD-10-CM

## 2020-12-27 DIAGNOSIS — Z1283 Encounter for screening for malignant neoplasm of skin: Secondary | ICD-10-CM | POA: Diagnosis not present

## 2020-12-27 DIAGNOSIS — L570 Actinic keratosis: Secondary | ICD-10-CM

## 2020-12-27 NOTE — Patient Instructions (Signed)

## 2020-12-28 ENCOUNTER — Ambulatory Visit (INDEPENDENT_AMBULATORY_CARE_PROVIDER_SITE_OTHER): Payer: Medicare Other | Admitting: Rehabilitative and Restorative Service Providers"

## 2020-12-28 ENCOUNTER — Encounter: Payer: Self-pay | Admitting: Rehabilitative and Restorative Service Providers"

## 2020-12-28 DIAGNOSIS — M25512 Pain in left shoulder: Secondary | ICD-10-CM

## 2020-12-28 DIAGNOSIS — R29898 Other symptoms and signs involving the musculoskeletal system: Secondary | ICD-10-CM | POA: Diagnosis present

## 2020-12-28 NOTE — Patient Instructions (Signed)
Access Code: [email or web address] URL: [email or web address] Date: 12/28/2020 Prepared by: Rudell Cobb  Program Notes TENNIS BALL:  use a tennis ball against the wall on the side of your shoulder blade (left arm can be flexed forward) and roll over the ball to reduce muscle tightness x 2-3 minutes as tolerated.    Exercises Doorway Pec Stretch at 60 Elevation - 2 x daily - 7 x weekly - 1 sets - 3 reps - 30 seconds hold Doorway Pec Stretch at 120 Degrees Abduction - 2 x daily - 7 x weekly - 1 sets - 3 reps - 30 seconds hold Standing Bicep Stretch at [place] 2 x daily - 7 x weekly - 1 sets - 10 reps Standing Shoulder Horizontal Abduction with Resistance - 2 x daily - 7 x weekly - 1 sets - 10 reps Bird Dog - 2 x daily - 7 x weekly - 1 sets - 10 reps - 2-3 sec hold Prone Scapular Slide with Shoulder Extension - 2 x daily - 7 x weekly - 1 sets - 10 reps Full Plank - 2 x daily - 7 x weekly - 1 sets - 10 reps  Patient Education Trigger Point Dry Needling

## 2020-12-28 NOTE — Therapy (Signed)
[place], [place], [address] Phone: [phone]   Fax:  [phone]  Physical Therapy Treatment  Patient Details  Name: John Barrett MRN: [id number] Date of Birth: 1951-06-29 Referring Provider (PT): Waunita Schooner, MD   Encounter Date: 12/28/2020   PT End of Session - 12/28/20 1624    Visit Number 6    Number of Visits 12    Date for PT Re-Evaluation 01/22/21    Authorization Type Medicare and BCBS supplement    PT Start Time 1615    PT Stop Time 1700    PT Time Calculation (min) 45 min    Activity Tolerance Patient tolerated treatment well    Behavior During Therapy [facility] for tasks assessed/performed           Past Medical History:  Diagnosis Date  . Allergy    allergy eye drops prn  . Cellulitis    hospitalized 1995  . Chicken pox   . 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   . History of adenomatous polyp of colon    q5 years  . History of skin cancer    basal cell in past. Dr. Denna Haggard  arm, chest  . Psoriasis    has had to use orals in the past. Mainly controlled with triamcinolone per Dr. Denna Haggard  . Rosacea    hydrocortisone OTC per Dr. Denna Haggard  . Shingles    x2  . Soft tissue mass    left lower leg. removed by Dr. Harlow Asa  . 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 Squamous cell carcinoma of skin 05/10/2020   SCC MID BACK CX3 5FU    Past Surgical History:  Procedure Laterality Date  . APPENDECTOMY    . COLONOSCOPY  12/2014   Fuller Plan - TA polyps   . HEMORRHOID BANDING  2008  . MASS EXCISION Left 07/29/2014   Procedure: EXCISION SOFT TISSUE MASS LEFT LOWER LEG;  Surgeon: Armandina Gemma, MD;  Location: [place];  Service: General;  Laterality: Left;  . POLYPECTOMY    . TONSILLECTOMY AND ADENOIDECTOMY    . VEIN LIGATION     of penis. 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   Subjective Assessment - 12/28/20 1623    Subjective The patient has more range of motion into extension.  He gets some soreness at end range.    Patient Stated Goals My biggest concern is doing damage causing increased or long term pain; have a good sense of what is going on.    Currently in Pain? No/denies              [facility] PT Assessment - 12/28/20 1629      Assessment   Medical Diagnosis L shoulder pain    Referring Provider (PT) Waunita Schooner, MD    Hand Dominance Right      AROM   Overall AROM Comments --    Right Shoulder Extension 70 Degrees    Left Shoulder Extension 50 Degrees   with pain                        [facility] Adult PT Treatment/Exercise - 12/28/20 1633      Self-Care   Self-Care Other Self-Care Comments    Other Self-Care Comments  discussed gym routine and patient able to return to 1 set of prior activities (with exception of military press); instead of progressing with bands in PT, recommended return to prior strength program as tolerated.  PT/patient discussed other activities and he did yoga in the past.  We will work on functional strength training/loading in order to return to yoga practice.      Exercises   Exercises Shoulder      Shoulder Exercises: Prone   Retraction Strengthening;Both;12 reps    Flexion Strengthening;Both;10 reps    Extension Strengthening;Both;10 reps    Other Prone Exercises extended plank position      Shoulder Exercises: ROM/Strengthening   UBE (Upper Arm Bike) L4 x 2 minutes forward/ 1 minute backwards      Shoulder Exercises: Stretch   Internal Rotation Stretch 2 reps    Internal Rotation Stretch Limitations 30 seconds    Wall Stretch - ABduction 30 seconds;1 rep    Other Shoulder Stretches sleeper stretch L sidelying                  PT Education - 12/28/20 1825    Education Details HEP    Person(s) Educated Patient    Methods  Explanation;Demonstration;Handout    Comprehension Verbalized understanding;Returned demonstration               PT Long Term Goals - 12/22/20 1514      PT LONG TERM GOAL #1   Title The patient will be indep with HEP.    Time 6    Period Weeks      PT LONG TERM GOAL #2   Title The patient will improve functional status score from 76% to > 80% to demo improving use of L upper extremity.    Time 6    Period Weeks      PT LONG TERM GOAL #3   Title The patient will report no pain with horizontal abduction of the L shoulder during functional tasks (donning a jacket, shirt, or reaching behind him).    Baseline Horizontal abduction no longer painful, however shoulder extension can still provoke anterior shoulder pain    Time 6    Period Weeks    Status Achieved      PT LONG TERM GOAL #4   Title The patient will report return to prior functional status without limitations from L shoulder.    Time 6    Period Weeks      PT LONG TERM GOAL #5   Title The patient will improve L shoulder IR to > or equal to 55 degrees.    Time 6    Period Weeks                 Plan - 12/28/20 1825    Clinical Impression Statement The patient is progressing well and able to tolerate a return to prior exercise routine.  PT modified HEP knowing he will be strength training at the gym.  We also reduced frequency next week to 1x/week in order to allow patient time to perform strength routine and assess response.    PT Treatment/Interventions ADLs/Self Care Home Management;Therapeutic activities;Therapeutic exercise;Electrical Stimulation;Iontophoresis 4mg /ml Dexamethasone;Moist Heat;Manual techniques;Dry needling;Taping;Patient/family education    PT Next Visit Plan Check goals, determine if home exercise routine for strength is tolerable.  Check IR/ER ROM    PT Home Exercise Plan [id number]    Consulted and Agree with Plan of Care Patient  Patient will benefit from skilled therapeutic  intervention in order to improve the following deficits and impairments:     Visit Diagnosis: Other symptoms and signs involving the musculoskeletal system  Acute pain of left shoulder     Problem List Patient Active Problem List   Diagnosis Date Noted  . Chronic left shoulder pain 11/25/2020  . Arthritis of left knee 12/18/2017  . Psoriasis   . BPH associated with nocturia 06/04/2017  . Allergy   . History of skin cancer   . Rosacea   . Grover's disease   . History of adenomatous polyp of colon   . Left knee pain 03/02/2013  . RESTLESS LEGS SYNDROME 07/27/2009    Dagan Heinz , PT 12/28/2020, 6:35 PM  [facility] [place], [place], [address] Phone: [phone]   Fax:  [phone]  Name: John Barrett MRN: [id number] Date of Birth: 05/25/1951

## 2021-01-03 ENCOUNTER — Telehealth: Payer: Self-pay | Admitting: Dermatology

## 2021-01-03 NOTE — Telephone Encounter (Signed)
Path to patient per Dr Denna Haggard no follow up needed

## 2021-01-03 NOTE — Telephone Encounter (Signed)
Results, ST 

## 2021-01-05 ENCOUNTER — Encounter: Payer: Self-pay | Admitting: Dermatology

## 2021-01-05 ENCOUNTER — Other Ambulatory Visit: Payer: Self-pay

## 2021-01-05 ENCOUNTER — Encounter: Payer: Self-pay | Admitting: Rehabilitative and Restorative Service Providers"

## 2021-01-05 ENCOUNTER — Ambulatory Visit (INDEPENDENT_AMBULATORY_CARE_PROVIDER_SITE_OTHER): Payer: Medicare Other | Admitting: Rehabilitative and Restorative Service Providers"

## 2021-01-05 DIAGNOSIS — R29898 Other symptoms and signs involving the musculoskeletal system: Secondary | ICD-10-CM

## 2021-01-05 DIAGNOSIS — M25512 Pain in left shoulder: Secondary | ICD-10-CM | POA: Diagnosis not present

## 2021-01-05 NOTE — Patient Instructions (Signed)
Access Code: [email or web address] URL: [email or web address] Date: 01/05/2021 Prepared by: Rudell Cobb  Program Notes TENNIS BALL:  use a tennis ball against the wall on the side of your shoulder blade (left arm can be flexed forward) and roll over the ball to reduce muscle tightness x 2-3 minutes as tolerated.    Exercises Doorway Pec Stretch at 60 Elevation - 2 x daily - 7 x weekly - 1 sets - 3 reps - 30 seconds hold Doorway Pec Stretch at 120 Degrees Abduction - 2 x daily - 7 x weekly - 1 sets - 3 reps - 30 seconds hold Standing with Forearms Thoracic Rotation - 2 x daily - 7 x weekly - 1 sets - 10 reps Standing Bicep Stretch at Wall - 2 x daily - 7 x weekly - 1 sets - 10 reps Standing Shoulder Horizontal Abduction with Resistance - 2 x daily - 7 x weekly - 1 sets - 10 reps Bird Dog - 2 x daily - 7 x weekly - 1 sets - 10 reps - 2-3 sec hold Prone Scapular Slide with Shoulder Extension - 2 x daily - 7 x weekly - 1 sets - 10 reps Sidelying Open Book Thoracic Lumbar Rotation and Extension - 2 x daily - 7 x weekly - 1 sets - 5 reps - 10-15 seconds hold

## 2021-01-05 NOTE — Therapy (Signed)
[place], [place], [address] Phone: [phone]   Fax:  [phone]  Physical Therapy Treatment  Patient Details  Name: John Barrett MRN: [id number] Date of Birth: January 23, 1951 Referring Provider (PT): Waunita Schooner, MD   Encounter Date: 01/05/2021   PT End of Session - 01/05/21 1407    Visit Number 7    Number of Visits 12    Date for PT Re-Evaluation 01/22/21    Authorization Type Medicare and BCBS supplement    PT Start Time 1404    PT Stop Time 1445    PT Time Calculation (min) 41 min    Activity Tolerance Patient tolerated treatment well    Behavior During Therapy [facility] for tasks assessed/performed           Past Medical History:  Diagnosis Date  . Allergy    allergy eye drops prn  . Cellulitis    hospitalized 1995  . Chicken pox   . 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   . History of adenomatous polyp of colon    q5 years  . History of skin cancer    basal cell in past. Dr. Denna Haggard  arm, chest  . Psoriasis    has had to use orals in the past. Mainly controlled with triamcinolone per Dr. Denna Haggard  . Rosacea    hydrocortisone OTC per Dr. Denna Haggard  . Shingles    x2  . Soft tissue mass    left lower leg. removed by Dr. Harlow Asa  . 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 Squamous cell carcinoma of skin 05/10/2020   SCC MID BACK CX3 5FU    Past Surgical History:  Procedure Laterality Date  . APPENDECTOMY    . COLONOSCOPY  12/2014   Fuller Plan - TA polyps   . HEMORRHOID BANDING  2008  . MASS EXCISION Left 07/29/2014   Procedure: EXCISION SOFT TISSUE MASS LEFT LOWER LEG;  Surgeon: Armandina Gemma, MD;  Location: [place];  Service: General;  Laterality: Left;  . POLYPECTOMY    . TONSILLECTOMY AND ADENOIDECTOMY    . VEIN LIGATION     of penis. 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   Subjective Assessment - 01/05/21 1405    Subjective The patient did yard work last weekend and developed some soreness in the shoulder joint (not the muscles).  He reports soreness has reduced since the yard work, but woke today with mild sensation of soreness.    Patient Stated Goals My biggest concern is doing damage causing increased or long term pain; have a good sense of what is going on.    Currently in Pain? Yes    Pain Score 2    "just enough to know it is there."   Pain Location Shoulder    Pain Descriptors / Indicators Sore    Pain Type Chronic pain    Pain Onset 1 to 4 weeks ago    Pain Frequency Intermittent    Aggravating Factors  sore after yard work    Pain Relieving Factors rest              [facility] PT Assessment - 01/05/21 1413      Assessment   Medical Diagnosis L shoulder pain    Referring Provider (PT) Waunita Schooner,  MD                         [facility] Adult PT Treatment/Exercise - 01/05/21 1413      Exercises   Exercises Shoulder      Shoulder Exercises: Prone   Other Prone Exercises extended plank painful with HEP, therefore tried plank on knees with rows and this was also painful;      Shoulder Exercises: Sidelying   Other Sidelying Exercises thoracic open book rotation      Shoulder Exercises: Standing   Other Standing Exercises thoracic rotation opening R<>L with elbow lean on wall      Shoulder Exercises: ROM/Strengthening   UBE (Upper Arm Bike) L4 x 2 minutes forward/ 1 minute back      Shoulder Exercises: Stretch   Corner Stretch Limitations door frame    Other Shoulder Stretches foam roller thoracic extension + chest stretch; towel roll stretch for chest opening    Other Shoulder Stretches thread the needle in quadriped with pain anterior shoulder      Manual Therapy   Manual Therapy Joint mobilization;Soft tissue mobilization    Manual therapy comments skilled palpation to assess response  to DN and STM    Joint Mobilization left AC joint ant>posterior grade II mobs    Soft tissue mobilization STM and IASTM L pectoralis musculature, deep tissue release and ischemic compression    Myofascial Release pec release            Trigger Point Dry Needling - 01/05/21 1419    Consent Given? Yes    Muscles Treated Upper Quadrant Pectoralis major    Dry Needling Comments left side    Pectoralis Major Response Palpable increased muscle length                PT Education - 01/05/21 1731    Education Details modified HEP (thoracic opening and removed extended plank)    Person(s) Educated Patient    Methods Explanation;Demonstration;Handout    Comprehension Verbalized understanding;Returned demonstration               PT Long Term Goals - 12/22/20 1514      PT LONG TERM GOAL #1   Title The patient will be indep with HEP.    Time 6    Period Weeks      PT LONG TERM GOAL #2   Title The patient will improve functional status score from 76% to > 80% to demo improving use of L upper extremity.    Time 6    Period Weeks      PT LONG TERM GOAL #3   Title The patient will report no pain with horizontal abduction of the L shoulder during functional tasks (donning a jacket, shirt, or reaching behind him).    Baseline Horizontal abduction no longer painful, however shoulder extension can still provoke anterior shoulder pain    Time 6    Period Weeks    Status Achieved      PT LONG TERM GOAL #4   Title The patient will report return to prior functional status without limitations from L shoulder.    Time 6    Period Weeks      PT LONG TERM GOAL #5   Title The patient will improve L shoulder IR to > or equal to 55 degrees.    Time 6    Period Weeks  Plan - 01/05/21 1732    Clinical Impression Statement The patient continues with L pec major/minor tightness that can lead to increased anterior shoulder soreness and discomfort.  PT worked on  soft tissue and dry needling release of pectoralis musculature.  Also modified HEP to include more thoracic rotation/opening with pec lengthening.  Plan to continue working to [place].    PT Treatment/Interventions ADLs/Self Care Home Management;Therapeutic activities;Therapeutic exercise;Electrical Stimulation;Iontophoresis 4mg /ml Dexamethasone;Moist Heat;Manual techniques;Dry needling;Taping;Patient/family education    PT Next Visit Plan Check goals, determine if home exercise routine for strength is tolerable.  Check IR/ER ROM    PT Home Exercise Plan [id number]    Consulted and Agree with Plan of Care Patient           Patient will benefit from skilled therapeutic intervention in order to improve the following deficits and impairments:     Visit Diagnosis: Other symptoms and signs involving the musculoskeletal system  Acute pain of left shoulder     Problem List Patient Active Problem List   Diagnosis Date Noted  . Chronic left shoulder pain 11/25/2020  . Arthritis of left knee 12/18/2017  . Psoriasis   . BPH associated with nocturia 06/04/2017  . Allergy   . History of skin cancer   . Rosacea   . Grover's disease   . History of adenomatous polyp of colon   . Left knee pain 03/02/2013  . RESTLESS LEGS SYNDROME 07/27/2009    [place] , PT 01/05/2021, 5:49 PM  [facility] [place], [place], [address] Phone: [phone]   Fax:  [phone]  Name: Samanyu Tinnell MRN: [id number] Date of Birth: [birth year]

## 2021-01-05 NOTE — Progress Notes (Signed)
Follow-Up Visit   Subjective  John Barrett is a 70 y.o. male who presents for the following: Annual Exam (Per patient skin check, whatever Dr. Denna Haggard thinks /Top right shoulder behind, inflamed, raised, extremly crusty, no tx used x months. Behind right ear bump that's raised more rounded, could possibly be two bumps x months. Spot on left lower abdomen, sore, itches, scaly x months. Multiple little growths in scalp x year. Check right breast.).  Several issues to discuss including new or enlarging growths on the scalp, right shoulder, and chest. Location:  Duration:  Quality:  Associated Signs/Symptoms: Modifying Factors:  Severity:  Timing: Context:   Objective  Well appearing patient in no apparent distress; mood and affect are within normal limits. Objective  Left Abdomen (side) - Upper: Waist up skin examination: No atypical pigmented lesions, no recurrent nonmelanoma skin cancer.  Objective  Right Shoulder - Anterior: Hemorrhagic 6 mm crust     Objective  Left Parietal Scalp: Pink pearly 5 mm crust     Objective  Right Occipital Scalp: 5 mm cobblestone pearly papule     Objective  Right Breast: Verrucous pink-tan slightly inflamed crust     Objective  Left Parotid Area, Left Temporal Scalp, Left Zygomatic Area: 2 to 4 mm flat pink hornlike crusts  Objective  Right Posterior Neck: White scar- clear  Objective  Neck - Posterior: Multiple white scar- clear   All skin waist up examined.   Assessment & Plan    Encounter for screening for malignant neoplasm of skin Left Abdomen (side) - Upper  Yearly skin check, encouraged to self examine twice annually.  Continued ultraviolet protection.  Neoplasm of uncertain behavior of skin (4) Right Shoulder - Anterior  Skin / nail biopsy Type of biopsy: tangential   Informed consent: discussed and consent obtained   Timeout: patient name, date of birth, surgical site, and procedure  verified   Procedure prep:  Patient was prepped and draped in usual sterile fashion (Non sterile) Prep type:  Chlorhexidine Anesthesia: the lesion was anesthetized in a standard fashion   Anesthetic:  1% lidocaine w/ epinephrine 1-100,000 local infiltration Instrument used: flexible razor blade   Outcome: patient tolerated procedure well   Post-procedure details: wound care instructions given    Destruction of lesion Complexity: simple   Destruction method: electrodesiccation and curettage   Informed consent: discussed and consent obtained   Timeout:  patient name, date of birth, surgical site, and procedure verified Anesthesia: the lesion was anesthetized in a standard fashion   Anesthetic:  1% lidocaine w/ epinephrine 1-100,000 local infiltration Curettage performed in three different directions: Yes   Curettage cycles:  1 Margin per side (cm):  0.1 Final wound size (cm):  1 Hemostasis achieved with:  aluminum chloride Outcome: patient tolerated procedure well with no complications   Post-procedure details: wound care instructions given    Specimen 1 - Surgical pathology Differential Diagnosis:bcc vs scc  Check Margins: No  Left Parietal Scalp  Skin / nail biopsy Type of biopsy: tangential   Informed consent: discussed and consent obtained   Timeout: patient name, date of birth, surgical site, and procedure verified   Procedure prep:  Patient was prepped and draped in usual sterile fashion (Non sterile) Prep type:  Chlorhexidine Anesthesia: the lesion was anesthetized in a standard fashion   Anesthetic:  1% lidocaine w/ epinephrine 1-100,000 local infiltration Instrument used: flexible razor blade   Outcome: patient tolerated procedure well   Post-procedure details: wound care instructions  given    Specimen 2 - Surgical pathology Differential Diagnosis: bcc vs scc  Check Margins: No  Right Occipital Scalp  Skin / nail biopsy Type of biopsy: tangential   Informed  consent: discussed and consent obtained   Timeout: patient name, date of birth, surgical site, and procedure verified   Procedure prep:  Patient was prepped and draped in usual sterile fashion (Non sterile) Prep type:  Chlorhexidine Anesthesia: the lesion was anesthetized in a standard fashion   Anesthetic:  1% lidocaine w/ epinephrine 1-100,000 local infiltration Instrument used: flexible razor blade   Outcome: patient tolerated procedure well   Post-procedure details: wound care instructions given    Specimen 3 - Surgical pathology Differential Diagnosis:bcc vs scc  Check Margins: No  Right Breast  Skin / nail biopsy Type of biopsy: tangential   Informed consent: discussed and consent obtained   Timeout: patient name, date of birth, surgical site, and procedure verified   Procedure prep:  Patient was prepped and draped in usual sterile fashion (Non sterile) Prep type:  Chlorhexidine Anesthesia: the lesion was anesthetized in a standard fashion   Anesthetic:  1% lidocaine w/ epinephrine 1-100,000 local infiltration Instrument used: flexible razor blade   Outcome: patient tolerated procedure well   Post-procedure details: wound care instructions given    Specimen 4 - Surgical pathology Differential Diagnosis: bcc vs scc  Check Margins: No  AK (actinic keratosis) (3) Left Zygomatic Area; Left Parotid Area; Left Temporal Scalp  Destruction of lesion - Left Parotid Area, Left Temporal Scalp, Left Zygomatic Area Complexity: simple   Destruction method: cryotherapy   Informed consent: discussed and consent obtained   Timeout:  patient name, date of birth, surgical site, and procedure verified Lesion destroyed using liquid nitrogen: Yes   Cryotherapy cycles:  3 Outcome: patient tolerated procedure well with no complications    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  The documentation on 01/05/21 for the exam, diagnosis, procedures, and orders are all accurate and complete.

## 2021-01-11 ENCOUNTER — Ambulatory Visit (INDEPENDENT_AMBULATORY_CARE_PROVIDER_SITE_OTHER): Payer: Medicare Other | Admitting: Rehabilitative and Restorative Service Providers"

## 2021-01-11 ENCOUNTER — Other Ambulatory Visit: Payer: Self-pay

## 2021-01-11 DIAGNOSIS — R29898 Other symptoms and signs involving the musculoskeletal system: Secondary | ICD-10-CM

## 2021-01-11 DIAGNOSIS — M25512 Pain in left shoulder: Secondary | ICD-10-CM

## 2021-01-11 NOTE — Therapy (Signed)
[place], [place], [address] Phone: [phone]   Fax:  [phone]  Physical Therapy Treatment and Discharge Summary  Patient Details  Name: John Barrett MRN: [id number] Date of Birth: 1951-01-05 Referring Provider (PT): Waunita Schooner, MD   Encounter Date: 01/11/2021   PT End of Session - 01/11/21 1404    Visit Number 8    Number of Visits 12    Date for PT Re-Evaluation 01/22/21    Authorization Type Medicare and BCBS supplement    PT Start Time 1400    PT Stop Time 1443    PT Time Calculation (min) 43 min    Activity Tolerance Patient tolerated treatment well    Behavior During Therapy [facility] for tasks assessed/performed           Past Medical History:  Diagnosis Date  . Allergy    allergy eye drops prn  . Cellulitis    hospitalized 1995  . Chicken pox   . 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   . History of adenomatous polyp of colon    q5 years  . History of skin cancer    basal cell in past. Dr. Denna Haggard  arm, chest  . Psoriasis    has had to use orals in the past. Mainly controlled with triamcinolone per Dr. Denna Haggard  . Rosacea    hydrocortisone OTC per Dr. Denna Haggard  . Shingles    x2  . Soft tissue mass    left lower leg. removed by Dr. Harlow Asa  . 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 Squamous cell carcinoma of skin 05/10/2020   SCC MID BACK CX3 5FU    Past Surgical History:  Procedure Laterality Date  . APPENDECTOMY    . COLONOSCOPY  12/2014   [facility] Plan - TA polyps   . HEMORRHOID BANDING  2008  . MASS EXCISION Left 07/29/2014   Procedure: EXCISION SOFT TISSUE MASS LEFT LOWER LEG;  Surgeon: Armandina Gemma, MD;  Location: [facility];  Service: General;  Laterality: Left;  . POLYPECTOMY    . TONSILLECTOMY AND ADENOIDECTOMY    . VEIN LIGATION     of penis. 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   Subjective Assessment - 01/11/21 1401    Subjective The patient has gone to the gym 2 times and got soreness in the superior aspect of his shoulder rated a 3/10.  It stopped hurting after Tuesday.  He did shoulder press, chest press @ 80% of his prior weight x 1 set.  He did standing rows with kettle bells.    Patient Stated Goals My biggest concern is doing damage causing increased or long term pain; have a good sense of what is going on.    Currently in Pain? No/denies              [facility] PT Assessment - 01/11/21 1510      Assessment   Medical Diagnosis L shoulder pain    Referring Provider (PT) Waunita Schooner, MD      AROM   Left Shoulder Internal Rotation 70 Degrees    Left Shoulder External Rotation 80 Degrees                         [facility] Adult  PT Treatment/Exercise - 01/11/21 1510      Self-Care   Self-Care Other Self-Care Comments    Other Self-Care Comments  modifications to home routine include avoiding  military press and other overhead resistance training.  Recommended begin with <90 degrees as he enters back into gym routine.  Also discussed home soft tissue mobliization.      Exercises   Exercises Shoulder      Shoulder Exercises: ROM/Strengthening   UBE (Upper Arm Bike) L4 x 2 minutes forward/1 minute backward      Shoulder Exercises: Stretch   Other Shoulder Stretches Neural gliding in standing performing lateral reaching and the medial nerve with wrist extension stretch      Manual Therapy   Manual Therapy Soft tissue mobilization;Myofascial release    Manual therapy comments to reduce muscle tightness and improve soft tissue mobility    Soft tissue mobilization STM and IASTM L pectoralis, L biceps short and long heads and deltoid    Myofascial Release pec release                  PT Education - 01/11/21 1443    Education Details HEP    Person(s) Educated Patient    Methods  Explanation;Demonstration;Handout    Comprehension Verbalized understanding;Returned demonstration               PT Long Term Goals - 01/11/21 1505      PT LONG TERM GOAL #1   Title The patient will be indep with HEP.    Status Achieved      PT LONG TERM GOAL #2   Title The patient will improve functional status score from 76% to > 80% to demo improving use of L upper extremity.    Baseline 87%    Status Achieved      PT LONG TERM GOAL #3   Title The patient will report no pain with horizontal abduction of the L shoulder during functional tasks (donning a jacket, shirt, or reaching behind him).    Status Achieved      PT LONG TERM GOAL #4   Title The patient will report return to prior functional status without limitations from L shoulder.    Status Achieved      PT LONG TERM GOAL #5   Title The patient will improve L shoulder IR to > or equal to 55 degrees.    Baseline 70 degrees IR    Status Achieved                 Plan - 01/11/21 1506    Clinical Impression Statement The patient has met all LTGs.  With palpation, he has some muscle tightness in bicipital groove and near deltoid insertion.  We discussed continuing to use the ball for soft tissue mobility and added neural glides to home program.    PT Treatment/Interventions ADLs/Self Care Home Management;Therapeutic activities;Therapeutic exercise;Electrical Stimulation;Iontophoresis 56m/ml Dexamethasone;Moist Heat;Manual techniques;Dry needling;Taping;Patient/family education    PT Next Visit Plan Discharge.    PT Home Exercise Plan [phone]   Consulted and Agree with Plan of Care Patient           Patient will benefit from skilled therapeutic intervention in order to improve the following deficits and impairments:     Visit Diagnosis: Other symptoms and signs involving the musculoskeletal system  Acute pain of left shoulder     Problem List Patient Active Problem List   Diagnosis Date Noted  .  Chronic left shoulder  pain 11/25/2020  . Arthritis of left knee 12/18/2017  . Psoriasis   . BPH associated with nocturia 06/04/2017  . Allergy   . History of skin cancer   . Rosacea   . Grover's disease   . History of adenomatous polyp of colon   . Left knee pain 03/02/2013  . RESTLESS LEGS SYNDROME 07/27/2009    PHYSICAL THERAPY DISCHARGE SUMMARY  Visits from Start of Care: 8  Current functional level related to goals / functional outcomes: See above   Remaining deficits: Has HEP, continues with tightness in L anterior shoulder into bicipital groove-has HEP to address   Education / Equipment: HEP, gym routine  Plan: Patient agrees to discharge.  Patient goals were met. Patient is being discharged due to meeting the stated rehab goals.  ?????         Thank you for the referral of this patient. Rudell Cobb, MPT  [place] 01/11/2021, 3:13 PM  [facility] [address] [place], [place], [address] Phone: [phone]   Fax:  [phone]  Name: John Barrett MRN: [id number] Date of Birth: 24-Aug-1951

## 2021-01-11 NOTE — Patient Instructions (Signed)
Access Code: [email or web address] URL: [email or web address] Date: 01/11/2021 Prepared by: Rudell Cobb  Program Notes TENNIS BALL:  use a tennis ball against the wall on the side of your shoulder blade (left arm can be flexed forward) and roll over the ball to reduce muscle tightness x 2-3 minutes as tolerated.    Exercises Doorway Pec Stretch at 60 Elevation - 2 x daily - 7 x weekly - 1 sets - 3 reps - 30 seconds hold Doorway Pec Stretch at 120 Degrees Abduction - 2 x daily - 7 x weekly - 1 sets - 3 reps - 30 seconds hold Standing with Forearms Thoracic Rotation - 2 x daily - 7 x weekly - 1 sets - 10 reps Standing Bicep Stretch at Wall - 2 x daily - 7 x weekly - 1 sets - 10 reps Standing Shoulder Horizontal Abduction with Resistance - 2 x daily - 7 x weekly - 1 sets - 10 reps Bird Dog - 2 x daily - 7 x weekly - 1 sets - 10 reps - 2-3 sec hold Prone Scapular Slide with Shoulder Extension - 2 x daily - 7 x weekly - 1 sets - 10 reps Sidelying Open Book Thoracic Lumbar Rotation and Extension - 2 x daily - 7 x weekly - 1 sets - 5 reps - 10-15 seconds hold Standing [place] Nerve Glide - 2 x daily - 7 x weekly - 1 sets - 10 reps Standing Median Nerve Glide - 2 x daily - 7 x weekly - 1 sets - 10 reps - 5 seconds hold

## 2021-02-28 ENCOUNTER — Other Ambulatory Visit: Payer: Self-pay | Admitting: Urology

## 2021-02-28 DIAGNOSIS — R972 Elevated prostate specific antigen [PSA]: Secondary | ICD-10-CM

## 2021-03-17 ENCOUNTER — Ambulatory Visit
Admission: RE | Admit: 2021-03-17 | Discharge: 2021-03-17 | Disposition: A | Discharge status: Home / Self Care | Payer: Medicare Other | Source: Ambulatory Visit | Attending: Urology | Admitting: Urology

## 2021-03-17 DIAGNOSIS — R972 Elevated prostate specific antigen [PSA]: Secondary | ICD-10-CM

## 2021-03-17 IMAGING — MR MR PROSTATE WO/W CM
12 series · 48 of 48 positions shown · IV contrast (multihance)
Comparison: [DATE] abdominopelvic CT.

CLINICAL DATA: PSA of

EXAM:
MR PROSTATE WITHOUT AND WITH CONTRAST
TECHNIQUE: Multiplanar multisequence MRI images were obtained of the pelvis
centered about the prostate. Pre and post contrast images were
obtained.
CONTRAST:  20mL MULTIHANCE GADOBENATE DIMEGLUMINE 529 MG/ML IV SOLN

[Series 3: T2 · coronal · 3.0mm · 0.56mm/px · 1 of 25 slices shown (1 of 3)]
[im 1/25]
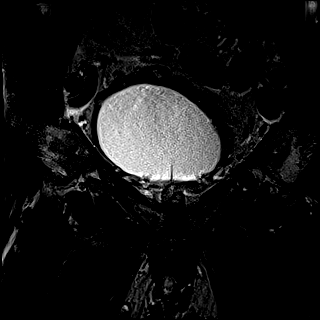

[Series 4: T1 · axial · 5.0mm · 1.25mm/px · 1 of 96 slices shown]
[im 1/96]
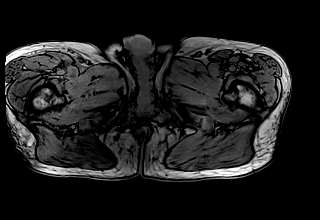

[Series 5: DWI · axial · 3.0mm · 1.75mm/px · z∈[-68,+19]mm · 2 of 90 slices shown (1 of 3)]
[im 1/90]
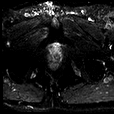
[im 90/90]
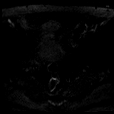

[Series 6: DWI · axial · 3.0mm · 1.75mm/px · 1 of 30 slices shown (2 of 3)]
[im 1/30]
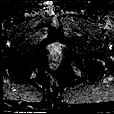

[Series 7: DWI · axial · 3.0mm · 1.75mm/px · 1 of 30 slices shown (3 of 3)]
[im 1/30]
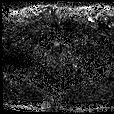

[Series 8: T2 · axial · 3.0mm · 0.56mm/px · 1 of 30 slices shown (2 of 3)]
[im 1/30]
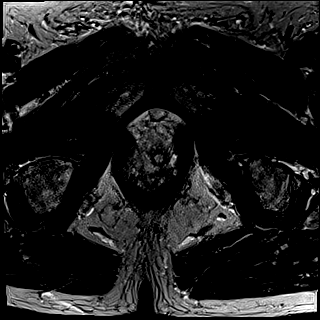

[Series 9: T2 · axial · 1.0mm · 1.04mm/px · z∈[-68,+19]mm · 2 of 88 slices shown (3 of 3)]
[im 1/88]
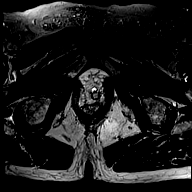
[im 88/88]
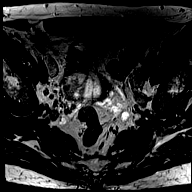

[Series 12: pre t1_twist_tra_dyn · axial · non-contrast · 3.5mm · 0.83mm/px · 1 of 26 slices shown]
[im 1/26]
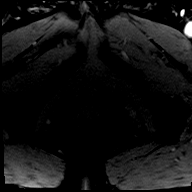

[Series 13: post t1_twist_tra_dyn-copy center · axial · non-contrast · 3.5mm · 0.83mm/px · z∈[-73,+15]mm · 17 of 780 slices shown]
[im 1/780]
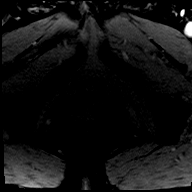
[im 49/780]
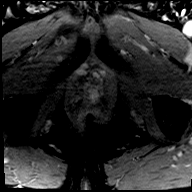
[im 98/780]
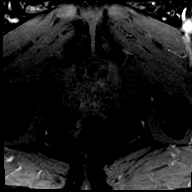
[im 147/780]
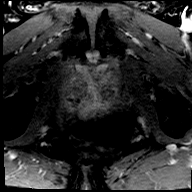
[im 195/780]
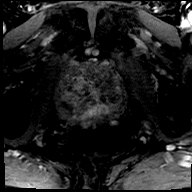
[im 244/780]
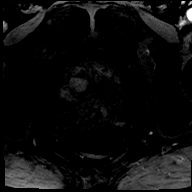
[im 293/780]
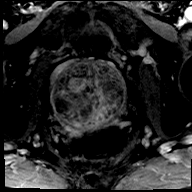
[im 341/780]
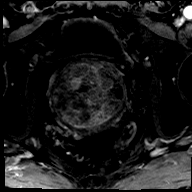
[im 390/780]
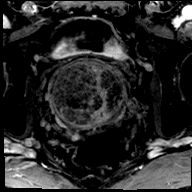
[im 439/780]
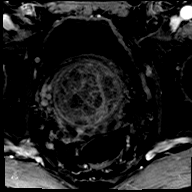
[im 487/780]
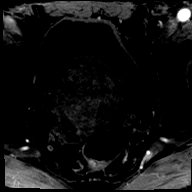
[im 536/780]
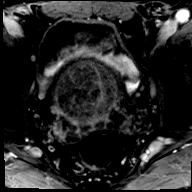
[im 585/780]
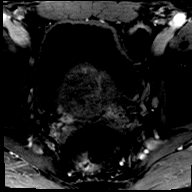
[im 633/780]
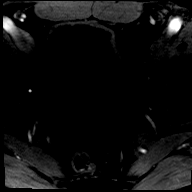
[im 682/780]
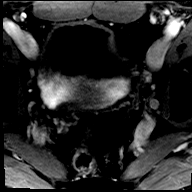
[im 731/780]
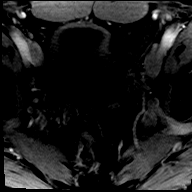
[im 780/780]
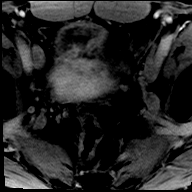

[Series 14: post t1_twist_tra_dyn-copy cent_sub · axial · 3.5mm · 0.83mm/px · z∈[-73,+15]mm · 17 of 754 slices shown]
[im 1/754]
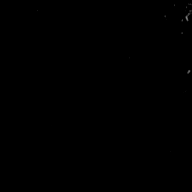
[im 48/754]
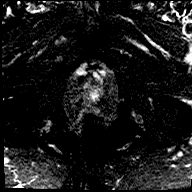
[im 95/754]
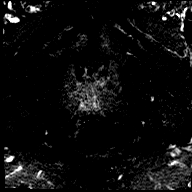
[im 142/754]
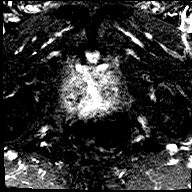
[im 189/754]
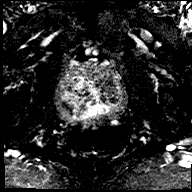
[im 236/754]
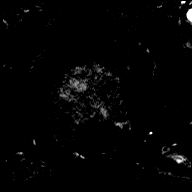
[im 283/754]
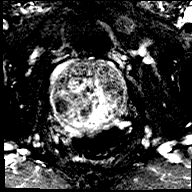
[im 330/754]
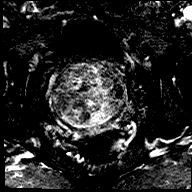
[im 377/754]
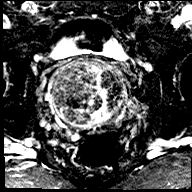
[im 424/754]
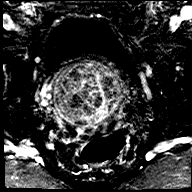
[im 471/754]
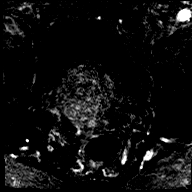
[im 518/754]
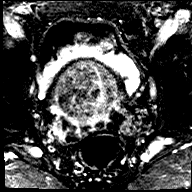
[im 565/754]
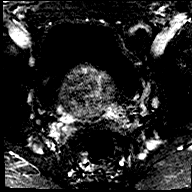
[im 612/754]
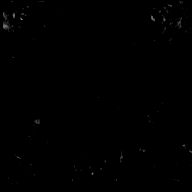
[im 659/754]
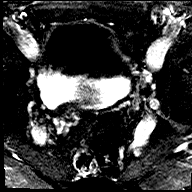
[im 706/754]
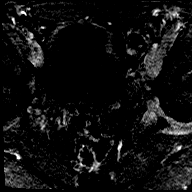
[im 754/754]
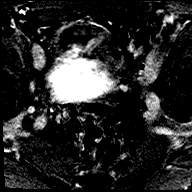

[Series 15: t1_vibe_dixon_tra_f · axial · 2.5mm · 1.14mm/px · z∈[-115,+142]mm · 2 of 104 slices shown]
[im 1/104]
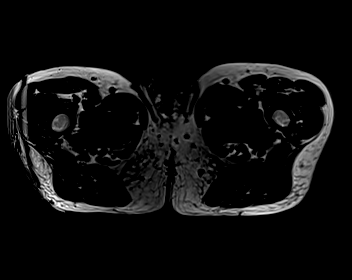
[im 104/104]
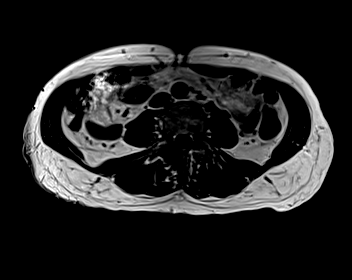

[Series 16: t1_vibe_dixon_tra_w · axial · 2.5mm · 1.14mm/px · z∈[-115,+142]mm · 2 of 104 slices shown]
[im 1/104]
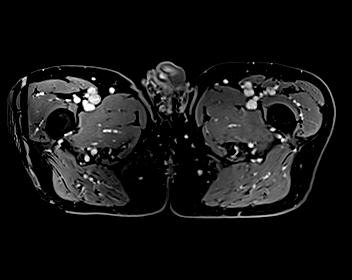
[im 104/104]
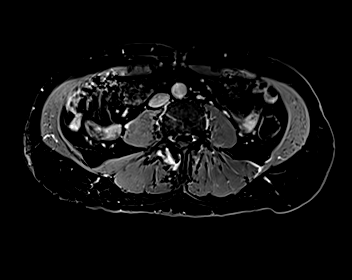

[48 of 48 positions shown; findings below may reference images not displayed]

FINDINGS: Prostate: Demonstrates moderate central gland enlargement and
heterogeneity, consistent with benign prostatic hyperplasia.

A nodule within the central gland demonstrates partially obscured
and partially ill-defined borders. Eccentric right at the level of
the mid gland. Example at 1.9 x 0.9 cm on [DATE]. Corresponds to
decreased signal on ADC map [DATE] and subtle hyperintensity on long B
value diffusion weighted image [DATE]. PI-RADS(v2.1)-3.

The peripheral zone is primarily compressed, without masslike T2
hypointensity, restricted diffusion, or early post-contrast
enhancement.

Volume: 6.1 x 6.1 x 6.8 cm (volume = 130 cm^3)

Transcapsular spread:  Absent

Seminal vesicle involvement: Absent

Neurovascular bundle involvement: Absent

Pelvic adenopathy: Absent

Bone metastasis: Absent

Other findings: No significant free fluid.  Normal urinary bladder.
IMPRESSION: 1. Right-sided central gland 1.9 cm nodule with equivocal
characteristics. PI-RADS(v2.1)-3.
2. No peripheral zone suspicious findings or evidence of locally
advanced/pelvic metastatic disease.

## 2021-03-17 MED ORDER — GADOBENATE DIMEGLUMINE 529 MG/ML IV SOLN
20.0000 mL | Freq: Once | INTRAVENOUS | Status: AC | PRN
  Administered 2021-03-17: 20 mL via INTRAVENOUS

## 2021-04-03 ENCOUNTER — Telehealth: Payer: Self-pay

## 2021-04-03 NOTE — Telephone Encounter (Signed)
Patient is requesting an appointment for his shingles vaccine and pneumonia for the same day is this okay or prefer to do them separately

## 2021-04-03 NOTE — Telephone Encounter (Signed)
Ok to get at the same time.

## 2021-04-05 ENCOUNTER — Ambulatory Visit: Payer: Medicare Other | Admitting: *Deleted

## 2021-04-05 ENCOUNTER — Other Ambulatory Visit: Payer: Self-pay

## 2021-04-13 ENCOUNTER — Encounter: Payer: Self-pay | Admitting: Family Medicine

## 2021-06-19 ENCOUNTER — Encounter: Payer: Self-pay | Admitting: Family Medicine

## 2021-06-20 ENCOUNTER — Other Ambulatory Visit: Payer: Self-pay

## 2021-06-20 DIAGNOSIS — H9190 Unspecified hearing loss, unspecified ear: Secondary | ICD-10-CM

## 2021-06-20 DIAGNOSIS — Z0110 Encounter for examination of ears and hearing without abnormal findings: Secondary | ICD-10-CM

## 2021-07-10 ENCOUNTER — Encounter: Payer: Self-pay | Admitting: Dermatology

## 2021-07-10 ENCOUNTER — Other Ambulatory Visit: Payer: Self-pay

## 2021-07-10 ENCOUNTER — Ambulatory Visit (INDEPENDENT_AMBULATORY_CARE_PROVIDER_SITE_OTHER): Payer: Medicare Other | Admitting: Dermatology

## 2021-07-10 DIAGNOSIS — L570 Actinic keratosis: Secondary | ICD-10-CM | POA: Diagnosis not present

## 2021-07-10 DIAGNOSIS — L111 Transient acantholytic dermatosis [Grover]: Secondary | ICD-10-CM

## 2021-07-10 DIAGNOSIS — L821 Other seborrheic keratosis: Secondary | ICD-10-CM | POA: Diagnosis not present

## 2021-07-10 DIAGNOSIS — L820 Inflamed seborrheic keratosis: Secondary | ICD-10-CM | POA: Diagnosis not present

## 2021-07-10 MED ORDER — CLOBETASOL PROP EMOLLIENT BASE 0.05 % EX CREA: TOPICAL_CREAM | CUTANEOUS | 2 refills | Status: DC

## 2021-07-10 MED ORDER — IMIQUIMOD 5 % EX CREA: TOPICAL_CREAM | CUTANEOUS | 0 refills | Status: DC

## 2021-07-10 NOTE — Progress Notes (Signed)
   Follow-Up Visit   Subjective  John Barrett John Barrett is a 70 y.o. male who presents for the following: Follow-up (Patient has a list).  Multiple areas to check. Location:  Duration:  Quality:  Associated Signs/Symptoms: Modifying Factors:  Severity:  Timing: Context:   Objective  Well appearing patient in no apparent distress; mood and affect are within normal limits. Head - Anterior (Face) Facial ln2 needed neck right forearm and forehead    All skin waist up examined.   Assessment & Plan    AK (actinic keratosis) Head - Anterior (Face)  Defer due to travel  imiquimod (ALDARA) 5 % cream - Head - Anterior (Face) Apply topical to forehead nightly Mon. Wed. 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 Bihl.  No atypical pigmented lesions waist up.   I, Lavonna Monarch, MD, have reviewed all documentation for this visit.  The documentation on 07/10/21 for the exam, diagnosis, procedures, and orders are all accurate and complete.

## 2021-07-21 ENCOUNTER — Encounter: Payer: Self-pay | Admitting: Dermatology

## 2021-08-13 ENCOUNTER — Other Ambulatory Visit: Payer: Self-pay

## 2021-08-13 ENCOUNTER — Ambulatory Visit (INDEPENDENT_AMBULATORY_CARE_PROVIDER_SITE_OTHER): Payer: Medicare Other | Admitting: Dermatology

## 2021-08-13 ENCOUNTER — Encounter: Payer: Self-pay | Admitting: Dermatology

## 2021-08-13 DIAGNOSIS — L111 Transient acantholytic dermatosis [Grover]: Secondary | ICD-10-CM | POA: Diagnosis not present

## 2021-08-13 DIAGNOSIS — L570 Actinic keratosis: Secondary | ICD-10-CM | POA: Diagnosis not present

## 2021-08-26 ENCOUNTER — Encounter: Payer: Self-pay | Admitting: Dermatology

## 2021-08-26 NOTE — Progress Notes (Signed)
   Follow-Up Visit   Subjective  John Barrett John Barrett is a 70 y.o. male who presents for the following: Follow-up (Follow for ln2 ).  Persistent crusts Location:  Duration:  Quality:  Associated Signs/Symptoms: Modifying Factors:  Severity:  Timing: Context:   Objective  Well appearing patient in no apparent distress; mood and affect are within normal limits. Head - Anterior (Face) (6), Left Concha (2), Right Forearm - Posterior Multiple 2 to 4 mm gritty and hornlike pink crusts       Mid Back, Right Inframammary Fold Still with multiple monomorphic 2 to 3 mm inflamed pink papules and minimal symptoms.    A focused examination was performed including waist up examination. Relevant physical exam findings are noted in the Assessment and Plan.   Assessment & Plan    AK (actinic keratosis) (9) Head - Anterior (Face) (6); Right Forearm - Posterior; Left Concha (2)  Destruction of lesion - Head - Anterior (Face), Left Concha, Right Forearm - Posterior Complexity: simple   Destruction method: cryotherapy   Informed consent: discussed and consent obtained   Timeout:  patient name, date of birth, surgical site, and procedure verified Lesion destroyed using liquid nitrogen: Yes   Cryotherapy cycles:  5 Outcome: patient tolerated procedure well with no complications   Post-procedure details: wound care instructions given    Related Medications imiquimod (ALDARA) 5 % cream Apply topical to forehead nightly Mon. Wed. Fri x 6 weeks  Grover's disease (2) Right Inframammary Fold; Mid Back  May continue clobetasol topically on as needed basis  Related Medications Clobetasol Prop Emollient Base 0.05 % emollient cream Apply topically 1- 2 (two) times a day if needed (not for face or skin folds)      I, Lavonna Monarch, MD, have reviewed all documentation for this visit.  The documentation on 08/26/21 for the exam, diagnosis, procedures, and orders are all accurate and  complete.

## 2022-02-04 ENCOUNTER — Ambulatory Visit (INDEPENDENT_AMBULATORY_CARE_PROVIDER_SITE_OTHER): Payer: Medicare Other | Admitting: Dermatology

## 2022-02-04 ENCOUNTER — Encounter: Payer: Self-pay | Admitting: Dermatology

## 2022-02-04 DIAGNOSIS — L570 Actinic keratosis: Secondary | ICD-10-CM

## 2022-02-04 DIAGNOSIS — D0462 Carcinoma in situ of skin of left upper limb, including shoulder: Secondary | ICD-10-CM | POA: Diagnosis not present

## 2022-02-04 DIAGNOSIS — Z1283 Encounter for screening for malignant neoplasm of skin: Secondary | ICD-10-CM | POA: Diagnosis not present

## 2022-02-04 DIAGNOSIS — L111 Transient acantholytic dermatosis [Grover]: Secondary | ICD-10-CM

## 2022-02-04 DIAGNOSIS — D485 Neoplasm of uncertain behavior of skin: Secondary | ICD-10-CM

## 2022-02-04 DIAGNOSIS — L820 Inflamed seborrheic keratosis: Secondary | ICD-10-CM | POA: Diagnosis not present

## 2022-02-04 MED ORDER — IMIQUIMOD 5 % EX CREA: TOPICAL_CREAM | CUTANEOUS | 1 refills | Status: DC

## 2022-02-04 MED ORDER — CLOBETASOL PROP EMOLLIENT BASE 0.05 % EX CREA: TOPICAL_CREAM | CUTANEOUS | 6 refills | Status: AC

## 2022-02-04 NOTE — Patient Instructions (Signed)

## 2022-02-22 ENCOUNTER — Encounter: Payer: Self-pay | Admitting: Dermatology

## 2022-02-22 NOTE — Progress Notes (Signed)
? ?Follow-Up Visit ?  ?Subjective  ?John Barrett is a 71 y.o. male who presents for the following: Annual Exam (No new concerns- ). ? ?Annual examination, larger crusts on chest and arm plus multiple smaller crusts.  Recheck Grovers disease. ?Location:  ?Duration:  ?Quality:  ?Associated Signs/Symptoms: ?Modifying Factors:  ?Severity:  ?Timing: ?Context:  ? ?Objective  ?Well appearing patient in no apparent distress; mood and affect are within normal limits. ?General skin examination: No atypical pigmented lesions (all checked with dermoscopy).  No recurrent melanoma skin cancer.  2 possible new nonmelanoma skin cancers chest and left arm will be biopsied and treated. ? ?Left Chest ?Hornlike 6 mm crust ? ? ? ? ? ? ?Chest - Medial [facility]), Left Chest, Right Posterior Auricle ?Mixture of horn like 3 mm pink crusts plus broader flatter lesions. ? ?Chest (Upper Torso, Anterior), Torso - Posterior (Back) ?Chronic papular truncal dermatitis, generally controlled with topical clobetasol ? ? ? ? ? ? ?Left Forearm - Anterior ?1cm mm waxy pink crust ? ? ? ? ? ? ? ? ?All skin waist up examined. ? ? ?Assessment & Plan  ? ? ?Encounter for screening for malignant neoplasm of skin ? ?Annual skin examination.  Continued ultraviolet protection. ? ?Neoplasm of uncertain behavior of skin ?Left Chest ? ?Skin / nail biopsy ?Type of biopsy: tangential   ?Informed consent: discussed and consent obtained   ?Timeout: patient name, date of birth, surgical site, and procedure verified   ?Procedure prep:  Patient was prepped and draped in usual sterile fashion (Non sterile) ?Prep type:  Chlorhexidine ?Anesthesia: the lesion was anesthetized in a standard fashion   ?Anesthetic:  1% lidocaine w/ epinephrine 1-100,000 local infiltration ?Instrument used: flexible razor blade   ?Outcome: patient tolerated procedure well   ?Post-procedure details: wound care instructions given   ? ?Destruction of lesion ?Complexity: simple   ?Destruction  method: electrodesiccation and curettage   ?Informed consent: discussed and consent obtained   ?Timeout:  patient name, date of birth, surgical site, and procedure verified ?Anesthesia: the lesion was anesthetized in a standard fashion   ?Anesthetic:  1% lidocaine w/ epinephrine 1-100,000 local infiltration ?Curettage performed in three different directions: Yes   ?Curettage cycles:  3 ?Margin per side (cm):  0.1 ?Final wound size (cm):  1.2 ?Hemostasis achieved with:  aluminum chloride ?Outcome: patient tolerated procedure well with no complications   ?Post-procedure details: wound care instructions given   ? ?Specimen 2 - Surgical pathology ?Differential Diagnosis: bcc vs scc- txpbx ?Check Margins: No ? ?After shave biopsy the base was treated with curettage plus cautery.  Told before procedure of the possibility of healing with keloid. ? ?AK (actinic keratosis) (3) ?Chest - Medial [facility]); Left Chest; Right Posterior Auricle ? ?Liquid nitrogen freeze to the thicker lesions, Aldara for the broader flat lesions.  Apply Monday Wednesday Friday for 6 to 8 weeks or until there is brisk reaction. ? ?imiquimod (ALDARA) 5 % cream - Chest - Medial [facility]) ?Apply topical to forehead nightly Mon. Wed. Fri x 6 weeks ? ?Destruction of lesion - Left Chest, Right Posterior Auricle ?Complexity: simple   ?Destruction method: cryotherapy   ?Informed consent: discussed and consent obtained   ?Timeout:  patient name, date of birth, surgical site, and procedure verified ?Lesion destroyed using liquid nitrogen: Yes   ?Cryotherapy cycles:  3 ?Outcome: patient tolerated procedure well with no complications   ? ?Grover's disease (2) ?Chest (Upper Torso, Anterior); Torso - Posterior (Back) ? ?Okay refills clobetasol,  understands to avoid use on face and body folds. ? ?Clobetasol Prop Emollient Base 0.05 % emollient cream - Chest (Upper Torso, Anterior), Torso - Posterior (Back) ?Apply topically 1- 2 (two) times a day if needed (not for  face or skin folds) ? 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 ?Skin / nail biopsy ?Type of biopsy: tangential   ?Informed consent: discussed and consent obtained   ?Timeout: patient name, date of birth, surgical site, and procedure verified   ?Procedure prep:  Patient was prepped and draped in usual sterile fashion (Non sterile) ?Prep type:  Chlorhexidine ?Anesthesia: the lesion was anesthetized in a standard fashion   ?Anesthetic:  1% lidocaine w/ epinephrine 1-100,000 local infiltration ?Outcome: patient tolerated procedure well   ?Post-procedure details: wound care instructions given   ? ?Destruction of lesion ?Complexity: simple   ?Destruction method: electrodesiccation and curettage   ?Informed consent: discussed and consent obtained   ?Timeout:  patient name, date of birth, surgical site, and procedure verified ?Anesthesia: the lesion was anesthetized in a standard fashion   ?Anesthetic:  1% lidocaine w/ epinephrine 1-100,000 local infiltration ?Curettage performed in three different directions: Yes   ?Curettage cycles:  3 ?Lesion length (cm):  1.2 ?Lesion width (cm):  1.2 ?Margin per side (cm):  0 ?Final wound size (cm):  1.2 ?Hemostasis achieved with:  aluminum chloride ?Outcome: patient tolerated procedure well with no complications   ?Post-procedure details: wound care instructions given   ? ?Specimen 1 - Surgical pathology ?Differential Diagnosis: bcc vs scc- txpbx ? ?Check Margins: No ? ?After shave biopsy the base was treated with curettage plus cautery ? ? ? ? ? ?I, Lavonna Monarch, MD, have reviewed all documentation for this visit.  The documentation on 02/22/22 for the exam, diagnosis, procedures, and orders are all accurate and complete. ?

## 2022-07-05 ENCOUNTER — Ambulatory Visit: Payer: Medicare Other | Attending: Cardiology | Admitting: Cardiology

## 2022-07-05 ENCOUNTER — Encounter: Payer: Self-pay | Admitting: *Deleted

## 2022-07-05 ENCOUNTER — Encounter: Payer: Self-pay | Admitting: Cardiology

## 2022-07-05 VITALS — BP 114/80 | HR 104 | Ht 73.0 in | Wt 167.4 lb

## 2022-07-05 DIAGNOSIS — I4819 Other persistent atrial fibrillation: Secondary | ICD-10-CM | POA: Diagnosis present

## 2022-07-05 DIAGNOSIS — I480 Paroxysmal atrial fibrillation: Secondary | ICD-10-CM

## 2022-07-05 MED ORDER — APIXABAN 5 MG PO TABS: 5.0000 mg | ORAL_TABLET | Freq: Two times a day (BID) | ORAL | 6 refills | Status: DC

## 2022-07-05 MED ORDER — DILTIAZEM HCL ER COATED BEADS 180 MG PO CP24: 180.0000 mg | ORAL_CAPSULE | Freq: Every day | ORAL | 6 refills | Status: DC

## 2022-07-05 NOTE — Patient Instructions (Addendum)
Medication Instructions:  Your physician has recommended you make the following change in your medication:  START Diltiazem 180 mg once daily START Eliquis 5 mg twice daily  *If you need a refill on your cardiac medications before your next appointment, please call your pharmacy*   Lab Work: Pre procedure labs -- see procedure instruction letter:  BMP & CBC  If you have labs (blood work) drawn today and your tests are completely normal, you will receive your results only by: [place] (if you have MyChart) OR A paper copy in the mail If you have any lab test that is abnormal or we need to change your treatment, we will call you to review the results.   Testing/Procedures: Your physician has requested that you have cardiac CT within 7 days PRIOR to your ablation. Cardiac computed tomography (CT) is a painless test that uses an x-ray machine to take clear, detailed pictures of your heart.  Please follow instruction below located under "other instructions". You will get a call from our office to schedule the date for this test.  Your physician has recommended that you have an ablation. Catheter ablation is a medical procedure used to treat some cardiac arrhythmias (irregular heartbeats). During catheter ablation, a long, thin, flexible tube is put into a blood vessel in your groin (upper thigh), or neck. This tube is called an ablation catheter. It is then guided to your heart through the blood vessel. Radio frequency waves destroy small areas of heart tissue where abnormal heartbeats may cause an arrhythmia to start. Please follow instruction letter given to you today.   Follow-Up: At [facility], you and your health needs are our priority.  As part of our continuing mission to provide you with exceptional heart care, we have created designated Provider Care Teams.  These Care Teams include your primary Cardiologist (physician) and Advanced Practice Providers (APPs -  Physician  Assistants and Nurse Practitioners) who all work together to provide you with the care you need, when you need it.  Your physician recommends that you schedule a follow-up appointment in: AFib clinic between 10/29/22 - 11/08/2022   Your next appointment:   1 month(s) after your ablation  The format for your next appointment:   In Person  Provider:   AFib clinic   Thank you for choosing [facility]!!   Trinidad Curet, RN [phone]    Other Instructions   Cardiac Ablation Cardiac ablation is a procedure to destroy (ablate) some heart tissue that is sending bad signals. These bad signals cause problems in heart rhythm. The heart has many areas that make these signals. If there are problems in these areas, they can make the heart beat in a way that is not normal. Destroying some tissues can help make the heart rhythm normal. Tell your doctor about: Any allergies you have. All medicines you are taking. These include vitamins, herbs, eye drops, creams, and over-the-counter medicines. Any problems you or family members have had with medicines that make you fall asleep (anesthetics). Any blood disorders you have. Any surgeries you have had. Any medical conditions you have, such as kidney failure. Whether you are pregnant or may be pregnant. What are the risks? This is a safe procedure. But problems may occur, including: Infection. Bruising and bleeding. Bleeding into the chest. Stroke or blood clots. Damage to nearby areas of your body. Allergies to medicines or dyes. The need for a pacemaker if the normal system is damaged. Failure of the procedure to  treat the problem. What happens before the procedure? Medicines Ask your doctor about: Changing or stopping your normal medicines. This is important. Taking aspirin and ibuprofen. Do not take these medicines unless your doctor tells you to take them. Taking other medicines, vitamins, herbs, and supplements. General  instructions Follow instructions from your doctor about what you cannot eat or drink. Plan to have someone take you home from the hospital or clinic. If you will be going home right after the procedure, plan to have someone with you for 24 hours. Ask your doctor what steps will be taken to prevent infection. What happens during the procedure?  An IV tube will be put into one of your veins. You will be given a medicine to help you relax. The skin on your neck or groin will be numbed. A cut (incision) will be made in your neck or groin. A needle will be put through your cut and into a large vein. A tube (catheter) will be put into the needle. The tube will be moved to your heart. Dye may be put through the tube. This helps your doctor see your heart. Small devices (electrodes) on the tube will send out signals. A type of energy will be used to destroy some heart tissue. The tube will be taken out. Pressure will be held on your cut. This helps stop bleeding. A bandage will be put over your cut. The exact procedure may vary among doctors and hospitals. What happens after the procedure? You will be watched until you leave the hospital or clinic. This includes checking your heart rate, breathing rate, oxygen, and blood pressure. Your cut will be watched for bleeding. You will need to lie still for a few hours. Do not drive for 24 hours or as long as your doctor tells you. Summary Cardiac ablation is a procedure to destroy some heart tissue. This is done to treat heart rhythm problems. Tell your doctor about any medical conditions you may have. Tell him or her about all medicines you are taking to treat them. This is a safe procedure. But problems may occur. These include infection, bruising, bleeding, and damage to nearby areas of your body. Follow what your doctor tells you about food and drink. You may also be told to change or stop some of your medicines. After the procedure, do not drive  for 24 hours or as long as your doctor tells you. This information is not intended to replace advice given to you by your health care provider. Make sure you discuss any questions you have with your health care provider. Document Revised: 01/04/2022 Document Reviewed: 09/16/2019 Elsevier Patient Education  [place].

## 2022-07-05 NOTE — Progress Notes (Signed)
Electrophysiology Office Note   Date:  07/05/2022   ID:  Loel Betancur, DOB 1951-05-04, MRN [id number]  PCP:  Marin Olp, MD  Cardiologist:   Primary Electrophysiologist:  Miray Mancino Meredith Leeds, MD    Chief Complaint: AF   History of Present Illness: John Barrett is a 71 y.o. male who is being seen today for the evaluation of AF at the request of Joya Martyr, MD. Presenting today for electrophysiology evaluation.  He presented to emergency room 06/18/2022 with irregular heartbeat.  He was found at [facility] stumbling around the store for approximately 1 hour.  He could not find his car in the parking lot.  His wife was called and he was taken to the emergency room.  He was oriented without complaint other than palpitations when presenting to the hospital.  He did admit to drinking alcohol prior to going to [place].  This to me that he is an alcoholic.  He has been drinking heavily for the last year.  Since his episode above, he has stopped drinking.  He is planning on entering a alcohol rehab program.  He feels poorly in atrial fibrillation with weakness, fatigue.  He would like to get back into normal rhythm.  He would like to avoid medications if possible.  Today, he denies symptoms of palpitations, chest pain, shortness of breath, orthopnea, PND, lower extremity edema, claudication, dizziness, presyncope, syncope, bleeding, or neurologic sequela. The patient is tolerating medications without difficulties.    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    History of adenomatous polyp of colon    q5 years   History of skin cancer    basal cell in past. Dr. Denna Haggard  arm, chest   Psoriasis    has had to use orals in the past. Mainly controlled with triamcinolone per Dr. Denna Haggard   Rosacea    hydrocortisone OTC per Dr.  Denna Haggard   Shingles    x2   Soft tissue mass    left lower leg. removed by Dr. 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   Mid Back   Squamous cell carcinoma of skin 05/10/2020   SCC MID BACK CX3 5FU   Past Surgical History:  Procedure Laterality Date   APPENDECTOMY     COLONOSCOPY  12/2014   [facility] Plan - TA polyps    HEMORRHOID BANDING  2008   MASS EXCISION Left 07/29/2014   Procedure: EXCISION SOFT TISSUE MASS LEFT LOWER LEG;  Surgeon: Armandina Gemma, MD;  Location: [place];  Service: General;  Laterality: Left;   POLYPECTOMY     TONSILLECTOMY AND ADENOIDECTOMY     VEIN LIGATION     of penis. cialis in past for ED related to vein ligation/leak and BPH     Current Outpatient Medications  Medication Sig Dispense Refill   alfuzosin (UROXATRAL) 10 MG 24 hr tablet Take 10 mg by mouth daily.     apixaban (ELIQUIS) 5 MG TABS tablet Take 1 tablet (5 mg total) by mouth 2 (two) times daily. 60 tablet 6   Clobetasol Prop Emollient Base 0.05 % emollient cream Apply topically 1- 2 (two) times a day if needed (not for face or skin folds) 60 g 6   cyanocobalamin (VITAMIN B12) 1000  MCG tablet Take 1 tablet by mouth daily in the afternoon.     diltiazem (CARDIZEM CD) 180 MG 24 hr capsule Take 1 capsule (180 mg total) by mouth daily. 30 capsule 6   Multiple Vitamin (MULTIVITAMIN) capsule Take 1 capsule by mouth daily.     Omega-3 Fatty Acids (FISH OIL) 1000 MG CAPS Take 1 capsule by mouth daily in the afternoon.     hydrocortisone cream 1 % Apply 1 application topically daily.  (Patient not taking: Reported on 07/05/2022)     imiquimod (ALDARA) 5 % cream Apply topical to forehead nightly Mon. Wed. Fri x 6 weeks (Patient not taking: Reported on 07/05/2022) 6 each 1   ketotifen (ZADITOR) 0.025 % ophthalmic solution 1 drop 2 (two) times daily. (Patient not taking: Reported on 07/05/2022)     No current facility-administered medications for this visit.    Allergies:   Patient  has no known allergies.   Social History:  The patient  reports that he has never smoked. He has never used smokeless tobacco. He reports current alcohol use of about 1.0 - 3.0 standard drink of alcohol per week. He reports that he does not use drugs.   Family History:  The patient's family history includes Atrial fibrillation in his father; Cancer in his father; Colon polyps in his father; Heart murmur in his father; Lung cancer in his paternal grandmother; Other in his maternal grandfather, maternal grandmother, mother, and paternal grandfather.    ROS:  Please see the history of present illness.   Otherwise, review of systems is positive for none.   All other systems are reviewed and negative.    PHYSICAL EXAM: VS:  BP 114/80   Pulse (!) 104   Ht '6\' 1"'$  (1.854 m)   Wt 167 lb 6.4 oz (75.9 kg)   SpO2 96%   BMI 22.09 kg/m  , BMI Body mass index is 22.09 kg/m. GEN: Well nourished, well developed, in no acute distress  HEENT: normal  Neck: no JVD, carotid bruits, or masses Cardiac: irregular; no murmurs, rubs, or gallops,no edema  Respiratory:  clear to auscultation bilaterally, normal work of breathing GI: soft, nontender, nondistended, + BS MS: no deformity or atrophy  Skin: warm and dry Neuro:  Strength and sensation are intact Psych: euthymic mood, full affect  EKG:  EKG is ordered today. Personal review of the ekg ordered shows atrial fibrillation, rate 104  Recent Labs: No results found for requested labs within last 365 days.    Lipid Panel     Component Value Date/Time   CHOL 178 06/04/2017 0945   TRIG 53.0 06/04/2017 0945   HDL 64.40 06/04/2017 0945   CHOLHDL 3 06/04/2017 0945   VLDL 10.6 06/04/2017 0945   LDLCALC 103 (H) 06/04/2017 0945     Wt Readings from Last 3 Encounters:  07/05/22 167 lb 6.4 oz (75.9 kg)  03/03/20 168 lb (76.2 kg)  02/18/20 168 lb (76.2 kg)      Other studies Reviewed: Additional studies/ records that were reviewed today include:  TTE 06/19/22  Review of the above records today demonstrates:  Left Ventricle  Left ventricle size is normal. Wall thickness is normal. EF: 60-65%. Ejection fraction measured by 3D is 61%, which is normal. Wall motion is normal. Doppler parameters indicate normal diastolic function.   Right Ventricle  Grossly dilated. Normal tricuspid annular plane systolic excursion (TAPSE) >1.7 cm. Normal systolic excursion velocity by TDI (>9.5 cm/s).   Left Atrium  Left atrium size  is normal. Atrial septum appears intact.   Right Atrium  Grossly dilated right atrium.   IVC/SVC  The inferior vena cava demonstrates a diameter of <=2.1 cm and collapses <50%; therefore, the right atrial pressure is estimated at 8 mmHg.   Mitral Valve  The leaflets are moderately thickened. There is trace regurgitation.   Tricuspid Valve  Tricuspid valve structure is normal. There is mild to moderate regurgitation. The right ventricular systolic pressure is normal (<36 mmHg).   Aortic Valve  The aortic valve is tricuspid. There is no regurgitation or stenosis.   Pulmonic Valve  Pulmonic valve structure is normal. Trace regurgitation.   Ascending Aorta  The aortic root is normal in size.   Pericardium  There is no pericardial effusion.    ASSESSMENT AND PLAN:  1.  Persistent atrial fibrillation/flutter: CHA2DS2-VASc of 1.  He feels poorly in atrial fibrillation.  He would like get back into normal rhythm.  He has been in atrial fibrillation for the last few weeks.  He would prefer to avoid medication management and would prefer ablation.  Prior to that, we Dabney Dever start him on Eliquis 5 mg twice daily and diltiazem 180 mg daily for heart rate control.  Risk, benefits, and alternatives to EP study and radiofrequency ablation for afib were also discussed in detail today. These risks include but are not limited to stroke, bleeding, vascular damage, tamponade, perforation, damage to the esophagus, lungs, and other  structures, pulmonary vein stenosis, worsening renal function, and death. The patient understands these risk and wishes to proceed.  We Areanna Gengler therefore proceed with catheter ablation at the next available time.  Carto, ICE, anesthesia are requested for the procedure.  Neiman Roots also obtain CT PV protocol prior to the procedure to exclude LAA thrombus and further evaluate atrial anatomy.   2.  Alcohol abuse: Has stopped drinking and has entered rehab  Current medicines are reviewed at length with the patient today.   The patient does not have concerns regarding his medicines.  The following changes were made today: Start Eliquis, diltiazem  Labs/ tests ordered today include:  Orders Placed This Encounter  Procedures   CT CARDIAC MORPH/PULM VEIN W/CM&W/O CA SCORE   EKG 12-Lead     Disposition:   FU with Rally Ouch 3 months  Signed, Nayvie Lips Meredith Leeds, MD  07/05/2022 9:44 AM     [place] [address] [address] [phone] (office) [phone] (fax)

## 2022-07-22 ENCOUNTER — Encounter: Payer: Self-pay | Admitting: *Deleted

## 2022-08-05 ENCOUNTER — Ambulatory Visit: Payer: Medicare Other | Admitting: Dermatology

## 2022-08-22 ENCOUNTER — Telehealth: Payer: Self-pay | Admitting: Cardiology

## 2022-08-22 NOTE — Telephone Encounter (Signed)
Patient is calling to talk with Dr. Curt Bears or nurse. Please call back

## 2022-08-22 NOTE — Telephone Encounter (Signed)
Pt has 8/9 days left of Eliquis. States it is going to be expensive, stated like $600/month. Pt informed that it should not cost that, aware I will send him the Eliquis pt assistance foundation number via mychart. Will also forward to [place] to follow  up with pt about this as well. Pt very appreciative of the help with this

## 2022-08-23 NOTE — Telephone Encounter (Signed)
Pt is returning call to speak to [facility], [place]. Transferred.

## 2022-08-23 NOTE — Telephone Encounter (Signed)
**Note De-Identified Jovita Persing Obfuscation** The pt is aware that we are leaving him a BMSPAF application for Eliquis assistance and 2 weeks of Eliquis 5 mg samples in the front office at [facility] at [address] [employer] office at [address] in [place] for him to pick up.  He states that he is going to call [employer]MS[employer] to ask them questions about their Eliquis assistance program, his eligibility to be approved for the program, and that he will call us back if he is advised that he is not eligible so we can discuss other options.  If he is aware that if he is eligible for approval to complete his part of the application, obtain required documents per BMSPAF, and to bring all to Dr Camnitz's office to drop off so we can complete the providers page of his application and we will fax all to [employer].  He verbalized understanding to all information given and thanked me for our assistance.

## 2022-08-26 NOTE — Telephone Encounter (Signed)
**Note De-Identified John Barrett Obfuscation** Late entry: On Friday 08/23/2022 at 1:22 pm the pt called me back and stated that per [employer]MS[employer],  he is not eligible for approval into their Eliquis assistance program due to his income exceeds their limit and he has not met his 3% out of pocket requirement for medications.  We discussed him switching to Xarelto until the 1st of 2024 and then he can switch back to Eliquis as there is a discount program, Xarelto with Me that provides Xarelto for $85/30 day supply or $240/ 90 day supply which is lower than the cost of his Eliquis at this time, and that all he would need to do is to call to enroll into this program.  He is aware that if he wants to switch to Xarelto we will discuss with Dr Curt Bears prior to making to switch. On Friday, the pt was insure of what he wanted to do but stated that he and his wife would discuss Xarelto over the weekend and would call us back if he wants to switch to Xarelto.

## 2022-08-30 NOTE — Telephone Encounter (Signed)
Late entry:  On 08/23/22 samples for Eliquis and BMSPAF application left up front for patient to pick up per request of PA nurse.

## 2022-09-04 ENCOUNTER — Encounter (HOSPITAL_BASED_OUTPATIENT_CLINIC_OR_DEPARTMENT_OTHER): Payer: Self-pay

## 2022-09-04 ENCOUNTER — Telehealth: Payer: Self-pay | Admitting: Emergency Medicine

## 2022-09-04 ENCOUNTER — Other Ambulatory Visit: Payer: Self-pay

## 2022-09-04 ENCOUNTER — Ambulatory Visit: Payer: Medicare Other

## 2022-09-04 ENCOUNTER — Emergency Department (HOSPITAL_BASED_OUTPATIENT_CLINIC_OR_DEPARTMENT_OTHER)
Admission: EM | Admit: 2022-09-04 | Discharge: 2022-09-04 | Disposition: A | Discharge status: Home / Self Care | Payer: Medicare Other | Attending: Emergency Medicine | Admitting: Emergency Medicine

## 2022-09-04 DIAGNOSIS — R339 Retention of urine, unspecified: Secondary | ICD-10-CM | POA: Diagnosis present

## 2022-09-04 DIAGNOSIS — Z7901 Long term (current) use of anticoagulants: Secondary | ICD-10-CM | POA: Diagnosis not present

## 2022-09-04 DIAGNOSIS — R140 Abdominal distension (gaseous): Secondary | ICD-10-CM | POA: Diagnosis not present

## 2022-09-04 DIAGNOSIS — Z85828 Personal history of other malignant neoplasm of skin: Secondary | ICD-10-CM | POA: Insufficient documentation

## 2022-09-04 DIAGNOSIS — R1030 Lower abdominal pain, unspecified: Secondary | ICD-10-CM | POA: Diagnosis not present

## 2022-09-04 LAB — URINALYSIS, ROUTINE W REFLEX MICROSCOPIC
Bilirubin Urine: NEGATIVE
Glucose, UA: NEGATIVE mg/dL
Ketones, ur: NEGATIVE mg/dL
Leukocytes,Ua: NEGATIVE
Nitrite: NEGATIVE
Protein, ur: NEGATIVE mg/dL
Specific Gravity, Urine: 1.009 (ref 1.005–1.030)
pH: 6.5 (ref 5.0–8.0)

## 2022-09-04 LAB — CBC WITH DIFFERENTIAL/PLATELET
Abs Immature Granulocytes: 0.02 10*3/uL (ref 0.00–0.07)
Basophils Absolute: 0 10*3/uL (ref 0.0–0.1)
Basophils Relative: 0 %
Eosinophils Absolute: 0 10*3/uL (ref 0.0–0.5)
Eosinophils Relative: 0 %
HCT: 44.2 % (ref 39.0–52.0)
Hemoglobin: 14.9 g/dL (ref 13.0–17.0)
Immature Granulocytes: 0 %
Lymphocytes Relative: 7 %
Lymphs Abs: 0.6 10*3/uL — ABNORMAL LOW (ref 0.7–4.0)
MCH: 30.8 pg (ref 26.0–34.0)
MCHC: 33.7 g/dL (ref 30.0–36.0)
MCV: 91.3 fL (ref 80.0–100.0)
Monocytes Absolute: 0.5 10*3/uL (ref 0.1–1.0)
Monocytes Relative: 6 %
Neutro Abs: 8 10*3/uL — ABNORMAL HIGH (ref 1.7–7.7)
Neutrophils Relative %: 87 %
Platelets: 165 10*3/uL (ref 150–400)
RBC: 4.84 MIL/uL (ref 4.22–5.81)
RDW: 12.9 % (ref 11.5–15.5)
WBC: 9.2 10*3/uL (ref 4.0–10.5)
nRBC: 0 % (ref 0.0–0.2)

## 2022-09-04 LAB — BASIC METABOLIC PANEL
Anion gap: 10 (ref 5–15)
BUN: 15 mg/dL (ref 8–23)
CO2: 25 mmol/L (ref 22–32)
Calcium: 9.2 mg/dL (ref 8.9–10.3)
Chloride: 107 mmol/L (ref 98–111)
Creatinine, Ser: 1.04 mg/dL (ref 0.61–1.24)
GFR, Estimated: >60 mL/min (ref >60)
Glucose, Bld: 102 mg/dL — ABNORMAL HIGH (ref 70–99)
Potassium: 3.9 mmol/L (ref 3.5–5.1)
Sodium: 142 mmol/L (ref 135–145)

## 2022-09-04 MED ORDER — LIDOCAINE HCL URETHRAL/MUCOSAL 2 % EX GEL
1.0000 | Freq: Once | CUTANEOUS | Status: AC
  Administered 2022-09-04: 1 via URETHRAL
  Filled 2022-09-04: qty 11

## 2022-09-04 NOTE — Telephone Encounter (Signed)
Call  to Tim by this RN - 2 identifiers confirmed - pt made an appointment today for urinary retention- RN redirected Tim to the ED due to the need for possible need for foley catheter & bladder scan.

## 2022-09-04 NOTE — Discharge Instructions (Signed)
We evaluated you for your urinary retention.  Your lab tests were reassuring, including kidney function.  Your urine test did not show signs of an infection, but we sent a urine culture to make sure that no bacteria grow from your urine.  If your urine culture is positive, we will call you.  I have attached information on how to care for your indwelling urinary catheter (Foley catheter).  Please schedule an appointment with Dr. Diona Fanti in around 1 week.  Please return if you develop any recurrent symptoms, Catheter problem, fevers or chills, flank pain, abdominal pain, or any other concerning symptoms.

## 2022-09-04 NOTE — ED Notes (Signed)
Over 1030m in urine on bladder scan

## 2022-09-04 NOTE — ED Notes (Signed)
Discharge paperwork given and verbally understood. Pt given multi leg bags and an explanation on how to switch the bags and what to looks for for return to the hospital.

## 2022-09-04 NOTE — ED Triage Notes (Signed)
Pt reports urinary retention since last night. Reports last time he voided was around 0100. Pt endorses lower abdominal pain.

## 2022-09-04 NOTE — ED Provider Notes (Signed)
[facility] EMERGENCY DEPT Provider Note  CSN: [id number] Arrival date & time: 09/04/22 1027  Chief Complaint(s) Urinary Retention  HPI John Barrett is a 71 y.o. male with a history of BPH presenting to the emergency department urinary retention.  Patient reports that he typically has dribbling urination at night, last night developed acute onset of unable to urinate.  He reports that he has had persistent inability to urinate on presentation to the ER.  He reports lower abdominal fullness and pain.  He denies any flank pain, fevers or chills, preceding dysuria or hematuria.  He is on Eliquis for A-fib.     Past Medical History 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    History of adenomatous polyp of colon    q5 years   History of skin cancer    basal cell in past. Dr. Denna Haggard  arm, chest   Psoriasis    has had to use orals in the past. Mainly controlled with triamcinolone per Dr. Denna Haggard   Rosacea    hydrocortisone OTC per Dr. Denna Haggard   Shingles    x2   Soft tissue mass    left lower leg. removed by Dr. 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 Authorizing Provider  alfuzosin (UROXATRAL) 10 MG 24 hr tablet Take 10 mg by mouth daily. 02/07/20   [provider]  apixaban (ELIQUIS) 5 MG TABS tablet Take 1 tablet (5 mg total) by mouth 2 (two) times daily. 07/05/22   Camnitz, Ocie Doyne, MD  Clobetasol Prop Emollient Base 0.05 % emollient cream Apply topically 1- 2 (two) times a day if needed (not for face or skin folds) 02/04/22   Lavonna Monarch, MD  cyanocobalamin (VITAMIN B12) 1000 MCG tablet Take 1 tablet by mouth daily in the afternoon.    [provider]  diltiazem (CARDIZEM CD) 180 MG 24 hr capsule Take 1 capsule (180 mg total) by mouth daily. 07/05/22   Camnitz, Will Hassell Done, MD  imiquimod Leroy Sea) 5 % cream Apply topical to forehead nightly Mon. Wed. Fri x 6 weeks Patient not taking: Reported on 07/05/2022 02/04/22   Lavonna Monarch, MD  Multiple Vitamin (MULTIVITAMIN) capsule Take 1 capsule by mouth daily.    [provider]  Omega-3 Fatty Acids (FISH OIL) 1000 MG CAPS Take 1 capsule by mouth daily in the afternoon.    [provider]  Past Surgical History Past Surgical History:  Procedure Laterality Date   APPENDECTOMY     COLONOSCOPY  12/2014   [facility] Plan - TA polyps    HEMORRHOID BANDING  2008   MASS EXCISION Left 07/29/2014   Procedure: EXCISION SOFT TISSUE MASS LEFT LOWER LEG;  Surgeon: Armandina Gemma, MD;  Location: [facility];  Service: General;  Laterality: Left;   POLYPECTOMY     TONSILLECTOMY AND ADENOIDECTOMY     VEIN LIGATION     of penis. cialis in past for ED related to vein ligation/leak and BPH   Family History Family History  Problem Relation Age of Onset   Other Mother        fall related at age 42 years old.  2010   Cancer Father    Colon polyps Father    Atrial fibrillation Father        coumadin. 17 and doing well   Heart murmur Father        unsure which valve but has valve issue   Other Maternal Grandmother        early 8s- "old age"    Other Maternal Grandfather        unclear cause   Lung cancer Paternal Grandmother        smoker   Other Paternal Grandfather        unclear cause   Sudden death Neg Hx    Hypertension Neg Hx    Hyperlipidemia Neg Hx    Heart attack Neg Hx    Diabetes Neg Hx    Colon cancer Neg Hx    Rectal cancer Neg Hx    Stomach cancer Neg Hx     Social History Social History   Tobacco Use   Smoking status: Never   Smokeless tobacco: Never  Vaping Use   Vaping Use: Never used  Substance Use Topics   Alcohol use: Yes    Alcohol/week: 1.0 - 3.0 standard drink of alcohol    Types: 1 - 3 Glasses of wine per week   Drug use: No   Allergies Patient has no known allergies.  Review of Systems Review of Systems  All other systems reviewed and are negative.   Physical Exam Vital Signs  I have reviewed the triage vital signs BP (!) 143/80 (BP Location: Right Arm)   Pulse 60   Temp 98 F (36.7 C) (Oral)   Resp 16   Ht '6\' 1"'$  (1.854 m)   Wt 74.4 kg   SpO2 100%   BMI 21.64 kg/m  Physical Exam Vitals and nursing note reviewed.  Constitutional:      General: He is not in acute distress.    Appearance: Normal appearance.  HENT:     Mouth/Throat:     Mouth: Mucous membranes are moist.  Eyes:     Conjunctiva/sclera: Conjunctivae normal.  Cardiovascular:     Rate and Rhythm: Normal rate and regular rhythm.  Pulmonary:     Effort: Pulmonary effort is normal. No respiratory distress.     Breath sounds: Normal breath sounds.  Abdominal:     General: Abdomen is flat. There is distension.     Palpations: Abdomen is soft.     Tenderness: There is abdominal tenderness (lower abdomen).  Musculoskeletal:     Right lower leg: No edema.     Left lower leg: No edema.  Skin:    General: Skin is warm and dry.     Capillary Refill: Capillary refill takes  less than 2 seconds.  Neurological:     Mental Status: He is alert and oriented to person, place, and time. Mental status is at baseline.   Psychiatric:        Mood and Affect: Mood normal.        Behavior: Behavior normal.     ED Results and Treatments Labs (all labs ordered are listed, but only abnormal results are displayed) Labs Reviewed  URINALYSIS, ROUTINE W REFLEX MICROSCOPIC - Abnormal; Notable for the following components:      Result Value   Hgb urine dipstick MODERATE (*)    Bacteria, UA RARE (*)    All other components within normal limits  BASIC METABOLIC PANEL - Abnormal; Notable for the following components:   Glucose, Bld 102 (*)    All other components within normal limits  CBC WITH DIFFERENTIAL/PLATELET - Abnormal; Notable for the following components:   Neutro Abs 8.0 (*)    Lymphs Abs 0.6 (*)    All other components within normal limits  URINE CULTURE                                                                                                                          Radiology No results found.  Pertinent labs & imaging results that were available during my care of the patient were reviewed by me and considered in my medical decision making (see MDM for details).  Medications Ordered in ED Medications  lidocaine (XYLOCAINE) 2 % jelly 1 Application (1 Application Urethral Given 09/04/22 1203)                                                                                                                                     Procedures Procedures  (including critical care time)  Medical Decision Making / ED Course   MDM:  71 year old male presenting with urinary retention.  Bladder scan performed by nursing with evidence of 1000 mL in bladder.  Foley catheter placed with relief of symptoms.  Given age, will check labs including urinalysis.  Differential includes urinary infection, renal failure, progression of BPH, less likely prostatitis without rectal pain or preceding infectious symptoms.  Patient already on treatment for BPH and has outside urologist.  If laboratory testing is  reassuring will discharge with outpatient follow-up  Clinical Course as of 09/04/22 1330  Wed Sep 04, 2022  1329 Lab testing reassuring including  normal creatinine.  Urinalysis with rare bacteria, doubt infection given otherwise reassuring urinalysis, urine cultures pending.  Discussed results with the patient.  Nursing will educate patient on Foley catheter care. Will discharge patient to home. All questions answered. Patient comfortable with plan of discharge. Return precautions discussed with patient and specified on the after visit summary.  [WS]    Clinical Course User Index [WS] Cristie Hem, MD     Additional history obtained: -External records from outside source obtained and reviewed including: Chart review including previous notes, labs, imaging, consultation notes including afib note 07/05/22   Lab Tests: -I ordered, reviewed, and interpreted labs.   The pertinent results include:   Labs Reviewed  URINALYSIS, ROUTINE W REFLEX MICROSCOPIC - Abnormal; Notable for the following components:      Result Value   Hgb urine dipstick MODERATE (*)    Bacteria, UA RARE (*)    All other components within normal limits  BASIC METABOLIC PANEL - Abnormal; Notable for the following components:   Glucose, Bld 102 (*)    All other components within normal limits  CBC WITH DIFFERENTIAL/PLATELET - Abnormal; Notable for the following components:   Neutro Abs 8.0 (*)    Lymphs Abs 0.6 (*)    All other components within normal limits  URINE CULTURE    Notable for rare bacturia, normal creatinine   Medicines ordered and prescription drug management: Meds ordered this encounter  Medications   lidocaine (XYLOCAINE) 2 % jelly 1 Application    -I have reviewed the patients home medicines and have made adjustments as needed    Reevaluation: After the interventions noted above, I reevaluated the patient and found that they have improved  Co morbidities that complicate the patient  evaluation  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    History of adenomatous polyp of colon    q5 years   History of skin cancer    basal cell in past. Dr. Denna Haggard  arm, chest   Psoriasis    has had to use orals in the past. Mainly controlled with triamcinolone per Dr. Denna Haggard   Rosacea    hydrocortisone OTC per Dr. Denna Haggard   Shingles    x2   Soft tissue mass    left lower leg. removed by Dr. 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    Final Clinical Impression(s) / ED Diagnoses Final diagnoses:  Urinary retention     This chart was dictated using voice recognition software.  Despite best efforts to proofread,  errors can occur which can change the documentation meaning.    Cristie Hem, MD 09/04/22 1330

## 2022-09-05 LAB — URINE CULTURE: Culture: NO GROWTH

## 2022-10-10 ENCOUNTER — Encounter: Payer: Self-pay | Admitting: *Deleted

## 2022-10-16 ENCOUNTER — Encounter: Payer: Self-pay | Admitting: Urology

## 2022-10-16 ENCOUNTER — Ambulatory Visit: Payer: Self-pay | Admitting: Urology

## 2022-10-16 ENCOUNTER — Ambulatory Visit (INDEPENDENT_AMBULATORY_CARE_PROVIDER_SITE_OTHER): Payer: Medicare Other | Admitting: Urology

## 2022-10-16 VITALS — BP 118/74 | HR 61 | Ht 73.0 in | Wt 165.0 lb

## 2022-10-16 DIAGNOSIS — N138 Other obstructive and reflux uropathy: Secondary | ICD-10-CM

## 2022-10-16 DIAGNOSIS — N401 Enlarged prostate with lower urinary tract symptoms: Secondary | ICD-10-CM

## 2022-10-16 NOTE — Patient Instructions (Signed)

## 2022-10-16 NOTE — Progress Notes (Signed)
10/16/22 1:40 PM   John Barrett 07/10/1951 [id number]  CC: BPH and urinary retention, elevated PSA, consider HOLEP  HPI: 71 year old male with long history of obstructive urinary symptoms as well as mildly elevated PSA.  He has been followed by Dr. Diona Fanti.  Prostate MRI in May 2022 showed a 130 g prostate with only a central PI-RADS 3 lesion, biopsy showed only benign prostate tissue.  He has had worsening urinary symptoms despite alfuzosin with weak stream, difficulty initiating stream, frequency, and urgency as well as nocturia 4-6 times at night.  He actually developed urinary retention in early November 2023 and a Foley catheter was placed.  Since that time he has seen Dr. Diona Fanti who taught him intermittent catheterization, and currently he is performing that every 3 to 4 days, with residual volumes of less than 100 now.  He previously was on finasteride as well.  He is interested in considering outlet procedures.  He also has an upcoming cardiac ablation on 11/16/2022.  He has been on Gemtesa recently which has improved his urgency and urge incontinence.  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   PMH: 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    History of adenomatous polyp of colon    q5 years   History of skin cancer    basal cell in past. Dr. Denna Haggard  arm, chest   Psoriasis    has had to use orals in the past. Mainly controlled with triamcinolone per Dr. Denna Haggard   Rosacea    hydrocortisone OTC per Dr. Denna Haggard   Shingles    x2   Soft tissue mass    left lower leg. removed by Dr. 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 cialis in past for ED related to vein ligation/leak and BPH      Family History: Family History  Problem Relation Age of Onset   Other Mother        fall related at age 6 years old.  2010   Cancer Father    Colon polyps Father    Atrial fibrillation Father        coumadin. 98 and doing well   Heart murmur Father        unsure which valve but has valve issue   Other Maternal Grandmother        early 47s- "old age"   Other Maternal Grandfather        unclear cause   Lung cancer Paternal Grandmother        smoker   Other Paternal Grandfather  unclear cause   Sudden death Neg Hx    Hypertension Neg Hx    Hyperlipidemia Neg Hx    Heart attack Neg Hx    Diabetes Neg Hx    Colon cancer Neg Hx    Rectal cancer Neg Hx    Stomach cancer Neg Hx     Social History:  reports that he has never smoked. He has never used smokeless tobacco. He reports current alcohol use of about 1.0 - 3.0 standard drink of alcohol per week. He reports that he does not use drugs.  Physical Exam: BP 118/74 (BP Location: Left Arm, Patient Position: Sitting, Cuff Size: Normal)   Pulse 61   Ht '6\' 1"'$  (1.854 m)   Wt 165 lb (74.8 kg)   BMI 21.77 kg/m    Constitutional:  Alert and oriented, No acute distress. Cardiovascular: No clubbing, cyanosis, or edema. Respiratory: Normal respiratory effort, no increased work of breathing. GI: Abdomen is soft, nontender, nondistended, no abdominal masses  Pertinent Imaging: I have personally viewed and interpreted the prostate MRI showing 130 g prostate, central PI-RADS 3  lesion biopsy-proven BPH.  Assessment & Plan:   71 year old male with 130 g prostate, obstructive urinary symptoms as well as some overactive symptoms and nocturia, interested in outlet procedures.  We discussed alternatives including intermittent catheterization, retrial of maximal medical therapy, or prostatic artery embolization.  We discussed the risks and benefits of HoLEP at length.  The procedure requires general anesthesia and takes 1 to 2 hours, and a holmium laser is used to enucleate the prostate and push this tissue into the bladder.  A morcellator is then used to remove this tissue, which is sent for pathology.  The vast majority(>95%) of patients are able to discharge the same day with a catheter in place for 2 to 3 days, and will follow-up in clinic for a voiding trial.  We specifically discussed the risks of bleeding, infection, retrograde ejaculation, temporary urgency and urge incontinence, very low risk of long-term incontinence, urethral stricture/bladder neck contracture, pathologic evaluation of prostate tissue and possible detection of prostate cancer or other malignancy, and possible need for additional procedures.  He prefers to schedule HOLEP in spring 2024 after completed cardiac ablation and off blood thinners  Nickolas Madrid, MD 10/16/2022   [address], [place], [place] [address] [phone]

## 2022-10-30 ENCOUNTER — Ambulatory Visit (HOSPITAL_COMMUNITY)
Admission: RE | Admit: 2022-10-30 | Discharge: 2022-10-30 | Disposition: A | Discharge status: Home / Self Care | Payer: Medicare Other | Source: Ambulatory Visit | Attending: Physician Assistant | Admitting: Physician Assistant

## 2022-10-30 ENCOUNTER — Encounter (HOSPITAL_COMMUNITY): Payer: Self-pay | Admitting: Physician Assistant

## 2022-10-30 VITALS — BP 126/82 | HR 60 | Ht 73.0 in | Wt 159.2 lb

## 2022-10-30 DIAGNOSIS — I4819 Other persistent atrial fibrillation: Secondary | ICD-10-CM | POA: Diagnosis not present

## 2022-10-30 DIAGNOSIS — Z8249 Family history of ischemic heart disease and other diseases of the circulatory system: Secondary | ICD-10-CM | POA: Diagnosis not present

## 2022-10-30 DIAGNOSIS — Z79899 Other long term (current) drug therapy: Secondary | ICD-10-CM | POA: Diagnosis not present

## 2022-10-30 DIAGNOSIS — Z7901 Long term (current) use of anticoagulants: Secondary | ICD-10-CM | POA: Insufficient documentation

## 2022-10-30 DIAGNOSIS — I4892 Unspecified atrial flutter: Secondary | ICD-10-CM | POA: Insufficient documentation

## 2022-10-30 LAB — CBC
HCT: 47.4 % (ref 39.0–52.0)
Hemoglobin: 15.2 g/dL (ref 13.0–17.0)
MCH: 30.1 pg (ref 26.0–34.0)
MCHC: 32.1 g/dL (ref 30.0–36.0)
MCV: 93.9 fL (ref 80.0–100.0)
Platelets: 181 10*3/uL (ref 150–400)
RBC: 5.05 MIL/uL (ref 4.22–5.81)
RDW: 13.4 % (ref 11.5–15.5)
WBC: 5.6 10*3/uL (ref 4.0–10.5)
nRBC: 0 % (ref 0.0–0.2)

## 2022-10-30 LAB — BASIC METABOLIC PANEL
Anion gap: 11 (ref 5–15)
BUN: 13 mg/dL (ref 8–23)
CO2: 25 mmol/L (ref 22–32)
Calcium: 8.9 mg/dL (ref 8.9–10.3)
Chloride: 102 mmol/L (ref 98–111)
Creatinine, Ser: 0.91 mg/dL (ref 0.61–1.24)
GFR, Estimated: >60 mL/min (ref >60)
Glucose, Bld: 104 mg/dL — ABNORMAL HIGH (ref 70–99)
Potassium: 4 mmol/L (ref 3.5–5.1)
Sodium: 138 mmol/L (ref 135–145)

## 2022-10-30 NOTE — Progress Notes (Signed)
Primary Care Physician: Marin Olp, MD Primary Cardiologist: none Primary Electrophysiologist: Dr Curt Bears Referring Physician: Dr Janeann Forehand John Barrett is a 72 y.o. male with a history of atrial flutter and atrial fibrillation who presents for follow up in the [facility].  The patient was initially diagnosed with atrial fibrillation after presenting to the emergency room 06/18/2022 with irregular heartbeat and palpitations. Seen by Dr Curt Bears 07/05/22 and scheduled for ablation 11/20/22. Patient is on Eliquis for a CHADS2VASC score of 1.  On follow up today, patient reports that he has done well since his last visit. He is in SR today. No bleeding issues on anticoagulation.   Today, he denies symptoms of palpitations, chest pain, shortness of breath, orthopnea, PND, lower extremity edema, dizziness, presyncope, syncope, snoring, daytime somnolence, bleeding, or neurologic sequela. The patient is tolerating medications without difficulties and is otherwise without complaint today.    Atrial Fibrillation Risk Factors:  he does not have symptoms or diagnosis of sleep apnea. he does not have a history of rheumatic fever. he does have a history of alcohol use.   he has a BMI of Body mass index is 21 kg/m.Marland Kitchen Filed Weights   10/30/22 0915  Weight: 72.2 kg    Family History  Problem Relation Age of Onset   Other Mother        fall related at age 14 years old.  2010   Cancer Father    Colon polyps Father    Atrial fibrillation Father        coumadin. 25 and doing well   Heart murmur Father        unsure which valve but has valve issue   Other Maternal Grandmother        early 71s- "old age"   Other Maternal Grandfather        unclear cause   Lung cancer Paternal Grandmother        smoker   Other Paternal Grandfather        unclear cause   Sudden death Neg Hx    Hypertension Neg Hx    Hyperlipidemia Neg Hx    Heart attack Neg Hx     Diabetes Neg Hx    Colon cancer Neg Hx    Rectal cancer Neg Hx    Stomach cancer Neg Hx      Atrial Fibrillation Management history:  Previous antiarrhythmic drugs: none Previous cardioversions: none Previous ablations: none CHADS2VASC score: 1 Anticoagulation history: Eliquis   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    History of adenomatous polyp of colon    q5 years   History of skin cancer    basal cell in past. Dr. Denna Haggard  arm, chest   Psoriasis    has had to use orals in the past. Mainly controlled with triamcinolone per Dr. Denna Haggard   Rosacea    hydrocortisone OTC per Dr. Denna Haggard   Shingles    x2   Soft tissue mass    left lower leg. removed by Dr. 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 cialis in past for ED related to vein ligation/leak and BPH    Current Outpatient Medications  Medication Sig Dispense Refill   alfuzosin (UROXATRAL) 10 MG 24 hr tablet Take 10 mg by mouth daily.     apixaban (ELIQUIS) 5 MG TABS tablet Take 1 tablet (5 mg total) by mouth 2 (two) times daily. 60 tablet 6   clobetasol cream (TEMOVATE) 8.54 % Apply 1 Application topically 2 (two) times daily.     Clobetasol Prop Emollient Base 0.05 % emollient cream Apply topically 1- 2 (two) times a day if needed (not for face or skin  folds) 60 g 6   diltiazem (CARDIZEM CD) 180 MG 24 hr capsule Take 1 capsule (180 mg total) by mouth daily. 30 capsule 6   Multiple Vitamin (MULTIVITAMIN) capsule Take 1 capsule by mouth daily.     Omega-3 Fatty Acids (FISH OIL) 1000 MG CAPS Take 1 capsule by mouth daily in the afternoon.     Vibegron (GEMTESA) 75 MG TABS Take 75 mg by mouth daily.     No current facility-administered medications for this encounter.    No Known Allergies  Social History   Socioeconomic History   Marital status: Married    Spouse name: Not on file   Number of children: Not on file   Years of education: Not on file   Highest education level: Not on file  Occupational History   Not on file  Tobacco Use   Smoking status: Never   Smokeless tobacco: Never   Tobacco comments:    Never smoke 10/30/2022  Vaping Use   Vaping Use: Never used  Substance and Sexual Activity   Alcohol use: Not Currently    Alcohol/week: 1.0 - 3.0 standard drink of alcohol    Types: 1 - 3 Glasses of wine per week    Comment: stop drinking 05/2022   Drug use: No   Sexual activity: Not on file  Other Topics Concern   Not on file  Social History Narrative   Married 12 years in 2018. 2 grown children and 2 grandkids (94 and 44 year old sons in 2018- live in [place]- moving to [place] soon). One son lives [place].    Mother in law moved in with them- Parkinsons 6 years old- Dr. 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 Masters- at [employer].    Retired from [employer] previously      [employer]: workout- yoga classes at [place]. Light weight workout. Fair amount of travel- enjoys this.    Social Determinants of Health   Financial Resource Strain: Not on file  Food Insecurity: Not on file  Transportation Needs: Not on file  Physical Activity: Not on file  Stress: Not on file  Social Connections: Not on file  Intimate Partner Violence: Not on file      ROS- All systems are reviewed and negative except as per the HPI above.  Physical Exam:  Vitals:   10/30/22 0915  BP: 126/82  Pulse: 60  Weight: 72.2 kg  Height: '6\' 1"'$  (1.854 m)    GEN- The patient is a well appearing male, alert and oriented x 3 today.   Head- normocephalic, atraumatic Eyes-  Sclera clear, conjunctiva pink Ears- hearing intact Oropharynx- clear Neck- supple  Lungs- Clear to ausculation bilaterally, normal work of breathing Heart- Regular rate and rhythm, no murmurs, rubs or gallops  GI- soft, NT, ND, + BS Extremities- no clubbing, cyanosis, or edema MS- no significant deformity or atrophy Skin- no rash or lesion Psych- euthymic mood, full affect Neuro- strength and sensation are intact  Wt Readings from Last 3 Encounters:  10/30/22 72.2 kg  10/16/22 74.8 kg  09/04/22 74.4 kg    EKG today demonstrates  SR, NST Vent. rate 60 BPM PR interval 154 ms QRS duration 88 ms QT/QTcB 406/406 ms   Echo 06/19/22 demonstrated  Left Ventricle  Left ventricle size is normal. Wall thickness is normal. EF: 60-65%. Ejection fraction measured by 3D is 61%, which is normal. Wall motion is normal. Doppler parameters indicate normal diastolic function.  Right Ventricle  Grossly dilated. Normal tricuspid annular plane systolic excursion (TAPSE) >1.7 cm. Normal systolic excursion velocity by TDI (>9.5 cm/s).  Left Atrium  Left atrium size is normal. Atrial septum appears intact.  Right Atrium  Grossly dilated right atrium.  IVC/SVC  The inferior vena cava demonstrates a diameter of <=2.1 cm and collapses <50%; therefore, the right atrial pressure is estimated at 8 mmHg.  Mitral Valve  The leaflets are moderately thickened. There is trace regurgitation.  Tricuspid Valve  Tricuspid valve structure is normal. There is mild to moderate regurgitation. The right ventricular systolic pressure is normal (<36 mmHg).  Aortic Valve  The aortic valve is tricuspid. There is  no regurgitation or stenosis.  Pulmonic Valve  Pulmonic valve structure is normal. Trace regurgitation.  Ascending Aorta  The aortic root is normal in size.  Pericardium  There is no pericardial effusion.    Epic records are reviewed at length today  CHA2DS2-VASc Score = 1  The patient's score is based upon: CHF History: 0 HTN History: 0 Diabetes History: 0 Stroke History: 0 Vascular Disease History: 0 Age Score: 1 Gender Score: 0       ASSESSMENT AND PLAN: 1. Persistent Atrial Fibrillation/atrial flutter The patient's CHA2DS2-VASc score is 1, indicating a 0.6% annual risk of stroke.   He is in SR today.  Patient scheduled for ablation with Dr Curt Bears on 11/20/22. CT scan 11/13/22 Check bmet/cbc today Continue Eliquis 5 mg BID Continue diltiazem 180 mg daily Ablation instructions printed and given to patient today.    Follow up for afib ablation as scheduled.    [facility] [address] [place], [place] [address] [phone] 10/30/2022 9:32 AM

## 2022-10-30 NOTE — H&P (View-Only) (Signed)
Primary Care Physician: Marin Olp, MD Primary Cardiologist: none Primary Electrophysiologist: Dr Curt Bears Referring Physician: Dr Janeann Forehand [name] is a 72 y.o. male with a history of atrial flutter and atrial fibrillation who presents for follow up in the [facility].  The patient was initially diagnosed with atrial fibrillation after presenting to the emergency room 06/18/2022 with irregular heartbeat and palpitations. Seen by Dr Curt Bears 07/05/22 and scheduled for ablation 11/20/22. Patient is on Eliquis for a CHADS2VASC score of 1.  On follow up today, patient reports that he has done well since his last visit. He is in SR today. No bleeding issues on anticoagulation.   Today, he denies symptoms of palpitations, chest pain, shortness of breath, orthopnea, PND, lower extremity edema, dizziness, presyncope, syncope, snoring, daytime somnolence, bleeding, or neurologic sequela. The patient is tolerating medications without difficulties and is otherwise without complaint today.    Atrial Fibrillation Risk Factors:  he does not have symptoms or diagnosis of sleep apnea. he does not have a history of rheumatic fever. he does have a history of alcohol use.   he has a BMI of Body mass index is 21 kg/m.Marland Kitchen Filed Weights   10/30/22 0915  Weight: 72.2 kg    Family History  Problem Relation Age of Onset   Other Mother        fall related at age 67 years old.  2010   Cancer Father    Colon polyps Father    Atrial fibrillation Father        coumadin. 20 and doing well   Heart murmur Father        unsure which valve but has valve issue   Other Maternal Grandmother        early 44s- "old age"   Other Maternal Grandfather        unclear cause   Lung cancer Paternal Grandmother        smoker   Other Paternal Grandfather        unclear cause   Sudden death Neg Hx    Hypertension Neg Hx    Hyperlipidemia Neg Hx    Heart attack Neg Hx     Diabetes Neg Hx    Colon cancer Neg Hx    Rectal cancer Neg Hx    Stomach cancer Neg Hx      Atrial Fibrillation Management history:  Previous antiarrhythmic drugs: none Previous cardioversions: none Previous ablations: none CHADS2VASC score: 1 Anticoagulation history: Eliquis   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    History of adenomatous polyp of colon    q5 years   History of skin cancer    basal cell in past. Dr. Denna Haggard  arm, chest   Psoriasis    has had to use orals in the past. Mainly controlled with triamcinolone per Dr. Denna Haggard   Rosacea    hydrocortisone OTC per Dr. Denna Haggard   Shingles    x2   Soft tissue mass    left lower leg. removed by Dr. 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 cialis in past for ED related to vein ligation/leak and BPH    Current Outpatient Medications  Medication Sig Dispense Refill   alfuzosin (UROXATRAL) 10 MG 24 hr tablet Take 10 mg by mouth daily.     apixaban (ELIQUIS) 5 MG TABS tablet Take 1 tablet (5 mg total) by mouth 2 (two) times daily. 60 tablet 6   clobetasol cream (TEMOVATE) 5.42 % Apply 1 Application topically 2 (two) times daily.     Clobetasol Prop Emollient Base 0.05 % emollient cream Apply topically 1- 2 (two) times a day if needed (not for face or skin  folds) 60 g 6   diltiazem (CARDIZEM CD) 180 MG 24 hr capsule Take 1 capsule (180 mg total) by mouth daily. 30 capsule 6   Multiple Vitamin (MULTIVITAMIN) capsule Take 1 capsule by mouth daily.     Omega-3 Fatty Acids (FISH OIL) 1000 MG CAPS Take 1 capsule by mouth daily in the afternoon.     Vibegron (GEMTESA) 75 MG TABS Take 75 mg by mouth daily.     No current facility-administered medications for this encounter.    No Known Allergies  Social History   Socioeconomic History   Marital status: Married    Spouse name: Not on file   Number of children: Not on file   Years of education: Not on file   Highest education level: Not on file  Occupational History   Not on file  Tobacco Use   Smoking status: Never   Smokeless tobacco: Never   Tobacco comments:    Never smoke 10/30/2022  Vaping Use   Vaping Use: Never used  Substance and Sexual Activity   Alcohol use: Not Currently    Alcohol/week: 1.0 - 3.0 standard drink of alcohol    Types: 1 - 3 Glasses of wine per week    Comment: stop drinking 05/2022   Drug use: No   Sexual activity: Not on file  Other Topics Concern   Not on file  Social History Narrative   Married 12 years in 2018. 2 grown children and 2 grandkids (76 and 63 year old sons in 2018- live in [place]- moving to [place] soon). One son lives [place].    Mother in law moved in with them- Parkinsons 61 years old- Dr. 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 Masters- at [employer].    Retired from [employer] previously      [employer]: workout- yoga classes at [place]. Light weight workout. Fair amount of travel- enjoys this.    Social Determinants of Health   Financial Resource Strain: Not on file  Food Insecurity: Not on file  Transportation Needs: Not on file  Physical Activity: Not on file  Stress: Not on file  Social Connections: Not on file  Intimate Partner Violence: Not on file      ROS- All systems are reviewed and negative except as per the HPI above.  Physical Exam:  Vitals:   10/30/22 0915  BP: 126/82  Pulse: 60  Weight: 72.2 kg  Height: '6\' 1"'$  (1.854 m)    GEN- The patient is a well appearing male, alert and oriented x 3 today.   Head- normocephalic, atraumatic Eyes-  Sclera clear, conjunctiva pink Ears- hearing intact Oropharynx- clear Neck- supple  Lungs- Clear to ausculation bilaterally, normal work of breathing Heart- Regular rate and rhythm, no murmurs, rubs or gallops  GI- soft, NT, ND, + BS Extremities- no clubbing, cyanosis, or edema MS- no significant deformity or atrophy Skin- no rash or lesion Psych- euthymic mood, full affect Neuro- strength and sensation are intact  Wt Readings from Last 3 Encounters:  10/30/22 72.2 kg  10/16/22 74.8 kg  09/04/22 74.4 kg    EKG today demonstrates  SR, NST Vent. rate 60 BPM PR interval 154 ms QRS duration 88 ms QT/QTcB 406/406 ms   Echo 06/19/22 demonstrated  Left Ventricle  Left ventricle size is normal. Wall thickness is normal. EF: 60-65%. Ejection fraction measured by 3D is 61%, which is normal. Wall motion is normal. Doppler parameters indicate normal diastolic function.  Right Ventricle  Grossly dilated. Normal tricuspid annular plane systolic excursion (TAPSE) >1.7 cm. Normal systolic excursion velocity by TDI (>9.5 cm/s).  Left Atrium  Left atrium size is normal. Atrial septum appears intact.  Right Atrium  Grossly dilated right atrium.  IVC/SVC  The inferior vena cava demonstrates a diameter of <=2.1 cm and collapses <50%; therefore, the right atrial pressure is estimated at 8 mmHg.  Mitral Valve  The leaflets are moderately thickened. There is trace regurgitation.  Tricuspid Valve  Tricuspid valve structure is normal. There is mild to moderate regurgitation. The right ventricular systolic pressure is normal (<36 mmHg).  Aortic Valve  The aortic valve is tricuspid. There is  no regurgitation or stenosis.  Pulmonic Valve  Pulmonic valve structure is normal. Trace regurgitation.  Ascending Aorta  The aortic root is normal in size.  Pericardium  There is no pericardial effusion.    Epic records are reviewed at length today  CHA2DS2-VASc Score = 1  The patient's score is based upon: CHF History: 0 HTN History: 0 Diabetes History: 0 Stroke History: 0 Vascular Disease History: 0 Age Score: 1 Gender Score: 0       ASSESSMENT AND PLAN: 1. Persistent Atrial Fibrillation/atrial flutter The patient's CHA2DS2-VASc score is 1, indicating a 0.6% annual risk of stroke.   He is in SR today.  Patient scheduled for ablation with Dr Curt Bears on 11/20/22. CT scan 11/13/22 Check bmet/cbc today Continue Eliquis 5 mg BID Continue diltiazem 180 mg daily Ablation instructions printed and given to patient today.    Follow up for afib ablation as scheduled.    [facility] [address] [place], [place] [address] [phone] 10/30/2022 9:32 AM

## 2022-11-11 ENCOUNTER — Telehealth (HOSPITAL_COMMUNITY): Payer: Self-pay | Admitting: Emergency Medicine

## 2022-11-11 NOTE — Telephone Encounter (Signed)
Reaching out to patient to offer assistance regarding upcoming cardiac imaging study; pt verbalizes understanding of appt date/time, parking situation and where to check in, pre-test NPO status and medications ordered, and verified current allergies; name and call back number provided for further questions should they arise John Bond RN Navigator Cardiac Imaging [facility] [phone] office [phone] cell   Arrival 1100 DWB Daily meds Denies iv issues Aware contrast

## 2022-11-13 ENCOUNTER — Encounter (HOSPITAL_BASED_OUTPATIENT_CLINIC_OR_DEPARTMENT_OTHER): Payer: Self-pay

## 2022-11-13 ENCOUNTER — Ambulatory Visit (HOSPITAL_BASED_OUTPATIENT_CLINIC_OR_DEPARTMENT_OTHER)
Admission: RE | Admit: 2022-11-13 | Discharge: 2022-11-13 | Disposition: A | Discharge status: Home / Self Care | Payer: Medicare Other | Source: Ambulatory Visit | Attending: Cardiology | Admitting: Cardiology

## 2022-11-13 DIAGNOSIS — I480 Paroxysmal atrial fibrillation: Secondary | ICD-10-CM | POA: Diagnosis present

## 2022-11-13 DIAGNOSIS — I77810 Thoracic aortic ectasia: Secondary | ICD-10-CM

## 2022-11-13 HISTORY — DX: Thoracic aortic ectasia: I77.810

## 2022-11-13 MED ORDER — IOHEXOL 350 MG/ML SOLN
100.0000 mL | Freq: Once | INTRAVENOUS | Status: AC | PRN
  Administered 2022-11-13: 80 mL via INTRAVENOUS

## 2022-11-19 NOTE — Anesthesia Preprocedure Evaluation (Signed)
Anesthesia Evaluation  Patient identified by MRN, date of birth, ID band Patient awake    Reviewed: Allergy & Precautions, NPO status , Patient's Chart, lab work & pertinent test results  Airway Mallampati: III  TM Distance: >3 FB Neck ROM: Full    Dental no notable dental hx. (+) Teeth Intact, Dental Advisory Given   Pulmonary neg pulmonary ROS   Pulmonary exam normal breath sounds clear to auscultation       Cardiovascular Normal cardiovascular exam+ dysrhythmias (eliquis) Atrial Fibrillation  Rhythm:Regular Rate:Normal     Neuro/Psych negative neurological ROS  negative psych ROS   GI/Hepatic negative GI ROS, Neg liver ROS,,,  Endo/Other  negative endocrine ROS    Renal/GU negative Renal ROS  negative genitourinary   Musculoskeletal  (+) Arthritis ,    Abdominal   Peds  Hematology negative hematology ROS (+)   Anesthesia Other Findings   Reproductive/Obstetrics                             Anesthesia Physical Anesthesia Plan  ASA: 2  Anesthesia Plan: General   Post-op Pain Management: Minimal or no pain anticipated   Induction: Intravenous  PONV Risk Score and Plan: 2 and Dexamethasone, Ondansetron and Treatment may vary due to age or medical condition  Airway Management Planned: Oral ETT  Additional Equipment:   Intra-op Plan:   Post-operative Plan: Extubation in OR  Informed Consent: I have reviewed the patients History and Physical, chart, labs and discussed the procedure including the risks, benefits and alternatives for the proposed anesthesia with the patient or authorized representative who has indicated his/her understanding and acceptance.     Dental advisory given  Plan Discussed with: CRNA  Anesthesia Plan Comments:        Anesthesia Quick Evaluation

## 2022-11-19 NOTE — Pre-Procedure Instructions (Signed)
Instructed patient on the following items: Arrival time 0730, procedure time changed to 930 Nothing to eat or drink after midnight No meds AM of procedure Responsible person to drive you home and stay with you for 24 hrs  Have you missed any doses of anti-coagulant Eliquis- hasn't missed any doses

## 2022-11-20 ENCOUNTER — Ambulatory Visit (HOSPITAL_BASED_OUTPATIENT_CLINIC_OR_DEPARTMENT_OTHER): Payer: Medicare Other | Admitting: Anesthesiology

## 2022-11-20 ENCOUNTER — Encounter (HOSPITAL_COMMUNITY)
Admission: RE | Disposition: A | Discharge status: Home / Self Care | Payer: Self-pay | Source: Ambulatory Visit | Attending: Cardiology

## 2022-11-20 ENCOUNTER — Other Ambulatory Visit (HOSPITAL_COMMUNITY): Payer: Self-pay

## 2022-11-20 ENCOUNTER — Other Ambulatory Visit: Payer: Self-pay

## 2022-11-20 ENCOUNTER — Ambulatory Visit (HOSPITAL_COMMUNITY): Payer: Medicare Other | Admitting: Anesthesiology

## 2022-11-20 ENCOUNTER — Ambulatory Visit (HOSPITAL_COMMUNITY)
Admission: RE | Admit: 2022-11-20 | Discharge: 2022-11-20 | Disposition: A | Discharge status: Home / Self Care | Payer: Medicare Other | Source: Ambulatory Visit | Attending: Cardiology | Admitting: Cardiology

## 2022-11-20 DIAGNOSIS — I4819 Other persistent atrial fibrillation: Secondary | ICD-10-CM | POA: Diagnosis present

## 2022-11-20 DIAGNOSIS — I4891 Unspecified atrial fibrillation: Secondary | ICD-10-CM

## 2022-11-20 DIAGNOSIS — Z7901 Long term (current) use of anticoagulants: Secondary | ICD-10-CM | POA: Diagnosis not present

## 2022-11-20 DIAGNOSIS — Z79899 Other long term (current) drug therapy: Secondary | ICD-10-CM | POA: Diagnosis not present

## 2022-11-20 HISTORY — PX: ATRIAL FIBRILLATION ABLATION: EP1191

## 2022-11-20 LAB — POCT ACTIVATED CLOTTING TIME
Activated Clotting Time: 255 seconds
Activated Clotting Time: 298 seconds
Activated Clotting Time: 325 seconds

## 2022-11-20 SURGERY — ATRIAL FIBRILLATION ABLATION
Anesthesia: General

## 2022-11-20 MED ORDER — HEPARIN SODIUM (PORCINE) 1000 UNIT/ML IJ SOLN
INTRAMUSCULAR | Status: AC
  Filled 2022-11-20: qty 10

## 2022-11-20 MED ORDER — ONDANSETRON HCL 4 MG/2ML IJ SOLN
INTRAMUSCULAR | Status: DC | PRN
  Administered 2022-11-20: 4 mg via INTRAVENOUS

## 2022-11-20 MED ORDER — SODIUM CHLORIDE 0.9 % IV SOLN: 250.0000 mL | INTRAVENOUS | Status: DC | PRN

## 2022-11-20 MED ORDER — PHENYLEPHRINE HCL (PRESSORS) 10 MG/ML IV SOLN
INTRAVENOUS | Status: DC | PRN
  Administered 2022-11-20 (×3): 160 ug via INTRAVENOUS
  Administered 2022-11-20 (×2): 80 ug via INTRAVENOUS

## 2022-11-20 MED ORDER — SODIUM CHLORIDE 0.9 % IV SOLN: INTRAVENOUS | Status: DC

## 2022-11-20 MED ORDER — PROTAMINE SULFATE 10 MG/ML IV SOLN
INTRAVENOUS | Status: DC | PRN
  Administered 2022-11-20 (×2): 10 mg via INTRAVENOUS
  Administered 2022-11-20: 20 mg via INTRAVENOUS

## 2022-11-20 MED ORDER — HEPARIN (PORCINE) IN NACL 1000-0.9 UT/500ML-% IV SOLN
INTRAVENOUS | Status: AC
  Filled 2022-11-20: qty 500

## 2022-11-20 MED ORDER — SUGAMMADEX SODIUM 200 MG/2ML IV SOLN
INTRAVENOUS | Status: DC | PRN
  Administered 2022-11-20: 125 mg via INTRAVENOUS

## 2022-11-20 MED ORDER — EPHEDRINE SULFATE (PRESSORS) 50 MG/ML IJ SOLN
INTRAMUSCULAR | Status: DC | PRN
  Administered 2022-11-20: 5 mg via INTRAVENOUS

## 2022-11-20 MED ORDER — ROCURONIUM BROMIDE 10 MG/ML (PF) SYRINGE
PREFILLED_SYRINGE | INTRAVENOUS | Status: DC | PRN
  Administered 2022-11-20: 70 mg via INTRAVENOUS

## 2022-11-20 MED ORDER — PHENYLEPHRINE 80 MCG/ML (10ML) SYRINGE FOR IV PUSH (FOR BLOOD PRESSURE SUPPORT)
PREFILLED_SYRINGE | INTRAVENOUS | Status: DC | PRN
  Administered 2022-11-20: 80 ug via INTRAVENOUS

## 2022-11-20 MED ORDER — DEXAMETHASONE SODIUM PHOSPHATE 10 MG/ML IJ SOLN
INTRAMUSCULAR | Status: DC | PRN
  Administered 2022-11-20: 5 mg via INTRAVENOUS

## 2022-11-20 MED ORDER — SODIUM CHLORIDE 0.9% FLUSH: 3.0000 mL | Freq: Two times a day (BID) | INTRAVENOUS | Status: DC

## 2022-11-20 MED ORDER — HEPARIN SODIUM (PORCINE) 1000 UNIT/ML IJ SOLN
INTRAMUSCULAR | Status: DC | PRN
  Administered 2022-11-20: 14000 [IU] via INTRAVENOUS
  Administered 2022-11-20: 4000 [IU] via INTRAVENOUS
  Administered 2022-11-20: 7000 [IU] via INTRAVENOUS

## 2022-11-20 MED ORDER — DOBUTAMINE INFUSION FOR EP/ECHO/NUC (1000 MCG/ML)
INTRAVENOUS | Status: AC
  Filled 2022-11-20: qty 250

## 2022-11-20 MED ORDER — ONDANSETRON HCL 4 MG/2ML IJ SOLN: 4.0000 mg | Freq: Four times a day (QID) | INTRAMUSCULAR | Status: DC | PRN

## 2022-11-20 MED ORDER — HEPARIN SODIUM (PORCINE) 1000 UNIT/ML IJ SOLN
INTRAMUSCULAR | Status: DC | PRN
  Administered 2022-11-20: 1000 [IU] via INTRAVENOUS

## 2022-11-20 MED ORDER — SODIUM CHLORIDE 0.9% FLUSH: 3.0000 mL | INTRAVENOUS | Status: DC | PRN

## 2022-11-20 MED ORDER — DOBUTAMINE INFUSION FOR EP/ECHO/NUC (1000 MCG/ML)
INTRAVENOUS | Status: DC | PRN
  Administered 2022-11-20: 20 ug/kg/min via INTRAVENOUS

## 2022-11-20 MED ORDER — LIDOCAINE 2% (20 MG/ML) 5 ML SYRINGE
INTRAMUSCULAR | Status: DC | PRN
  Administered 2022-11-20: 60 mg via INTRAVENOUS

## 2022-11-20 MED ORDER — ACETAMINOPHEN 325 MG PO TABS: 650.0000 mg | ORAL_TABLET | ORAL | Status: DC | PRN

## 2022-11-20 MED ORDER — COLCHICINE 0.6 MG PO TABS
0.6000 mg | ORAL_TABLET | Freq: Two times a day (BID) | ORAL | 0 refills | Status: DC
  Filled 2022-11-20: qty 14, 7d supply, fill #0

## 2022-11-20 MED ORDER — PHENYLEPHRINE HCL-NACL 20-0.9 MG/250ML-% IV SOLN
INTRAVENOUS | Status: DC | PRN
  Administered 2022-11-20: 25 ug/min via INTRAVENOUS

## 2022-11-20 MED ORDER — HEPARIN (PORCINE) IN NACL 1000-0.9 UT/500ML-% IV SOLN
INTRAVENOUS | Status: DC | PRN
  Administered 2022-11-20 (×4): 500 mL

## 2022-11-20 MED ORDER — FENTANYL CITRATE (PF) 250 MCG/5ML IJ SOLN
INTRAMUSCULAR | Status: DC | PRN
  Administered 2022-11-20: 50 ug via INTRAVENOUS

## 2022-11-20 MED ORDER — PROPOFOL 10 MG/ML IV BOLUS
INTRAVENOUS | Status: DC | PRN
  Administered 2022-11-20: 100 mg via INTRAVENOUS

## 2022-11-20 SURGICAL SUPPLY — 20 items
BAG SNAP BAND KOVER 36X36 (MISCELLANEOUS) IMPLANT
CATH 8FR REPROCESSED SOUNDSTAR (CATHETERS) ×1 IMPLANT
CATH 8FR SOUNDSTAR REPROCESSED (CATHETERS) IMPLANT
CATH ABLAT QDOT MICRO BI TC DF (CATHETERS) IMPLANT
CATH OCTARAY 2.0 F 3-3-3-3-3 (CATHETERS) IMPLANT
CATH PIGTAIL STEERABLE D1 8.7 (WIRE) IMPLANT
CATH S-M CIRCA TEMP PROBE (CATHETERS) IMPLANT
CATH WEB BI DIR CSDF CRV REPRO (CATHETERS) IMPLANT
CLOSURE PERCLOSE PROSTYLE (VASCULAR PRODUCTS) IMPLANT
COVER SWIFTLINK CONNECTOR (BAG) ×1 IMPLANT
PACK EP LATEX FREE (CUSTOM PROCEDURE TRAY) ×1
PACK EP LF (CUSTOM PROCEDURE TRAY) ×1 IMPLANT
PAD DEFIB RADIO PHYSIO CONN (PAD) ×1 IMPLANT
PATCH CARTO3 (PAD) IMPLANT
SHEATH CARTO VIZIGO SM CVD (SHEATH) IMPLANT
SHEATH PINNACLE 7F 10CM (SHEATH) IMPLANT
SHEATH PINNACLE 8F 10CM (SHEATH) IMPLANT
SHEATH PINNACLE 9F 10CM (SHEATH) IMPLANT
SHEATH PROBE COVER 6X72 (BAG) IMPLANT
TUBING SMART ABLATE COOLFLOW (TUBING) IMPLANT

## 2022-11-20 NOTE — Anesthesia Procedure Notes (Signed)
Procedure Name: Intubation Date/Time: 11/20/2022 8:56 AM  Performed by: Glynda Jaeger, CRNAPre-anesthesia Checklist: Patient identified, Patient being monitored, Timeout performed, Emergency Drugs available and Suction available Patient Re-evaluated:Patient Re-evaluated prior to induction Oxygen Delivery Method: Circle System Utilized Preoxygenation: Pre-oxygenation with 100% oxygen Induction Type: IV induction Ventilation: Mask ventilation without difficulty Laryngoscope Size: Mac and 4 Grade View: Grade II Tube type: Oral Tube size: 7.5 mm Number of attempts: 1 Airway Equipment and Method: Stylet Placement Confirmation: ETT inserted through vocal cords under direct vision, positive ETCO2 and breath sounds checked- equal and bilateral Secured at: 23 cm Tube secured with: Tape Dental Injury: Teeth and Oropharynx as per pre-operative assessment

## 2022-11-20 NOTE — Progress Notes (Signed)
Camnitz, MD at bedside and stated that patient is stable for discharge.

## 2022-11-20 NOTE — Discharge Instructions (Signed)

## 2022-11-20 NOTE — Transfer of Care (Signed)
Immediate Anesthesia Transfer of Care Note  Patient: John Barrett  Procedure(s) Performed: ATRIAL FIBRILLATION ABLATION  Patient Location: Cath Lab  Anesthesia Type:General  Level of Consciousness: awake, alert , oriented, patient cooperative, and responds to stimulation  Airway & Oxygen Therapy: Patient Spontanous Breathing and Patient connected to nasal cannula oxygen  Post-op Assessment: Report given to RN and Post -op Vital signs reviewed and stable  Post vital signs: Reviewed and stable  Last Vitals:  Vitals Value Taken Time  BP 122/68 11/20/22 1143  Temp    Pulse 85 11/20/22 1143  Resp 17 11/20/22 1143  SpO2 98 % 11/20/22 1143  Vitals shown include unvalidated device data.  Last Pain:  Vitals:   11/20/22 0801  TempSrc:   PainSc: 0-No pain         Complications: There were no known notable events for this encounter.

## 2022-11-20 NOTE — Progress Notes (Signed)
Patient alarm rang out sinus tach 140's. Patient stated heart felt like it was beating out his chest, otherwise felt fine. BP at that time dropped to 81/67, HR was 144. Groin sites checked, level 0.  After 5 minutes, HR back down to 80's and BP back up. PA paged, waiting on reply.

## 2022-11-20 NOTE — Interval H&P Note (Signed)
History and Physical Interval Note:  11/20/2022 7:50 AM  John Barrett  has presented today for surgery, with the diagnosis of afib.  The various methods of treatment have been discussed with the patient and family. After consideration of risks, benefits and other options for treatment, the patient has consented to  Procedure(s): ATRIAL FIBRILLATION ABLATION (N/A) as a surgical intervention.  The patient's history has been reviewed, patient examined, no change in status, stable for surgery.  I have reviewed the patient's chart and labs.  Questions were answered to the patient's satisfaction.     Darrold Bezek [employer]

## 2022-11-20 NOTE — Anesthesia Postprocedure Evaluation (Signed)
Anesthesia Post Note  Patient: John Barrett  Procedure(s) Performed: ATRIAL FIBRILLATION ABLATION     Patient location during evaluation: PACU Anesthesia Type: General Level of consciousness: awake and alert Pain management: pain level controlled Vital Signs Assessment: post-procedure vital signs reviewed and stable Respiratory status: spontaneous breathing, nonlabored ventilation, respiratory function stable and patient connected to nasal cannula oxygen Cardiovascular status: blood pressure returned to baseline and stable Postop Assessment: no apparent nausea or vomiting Anesthetic complications: no  There were no known notable events for this encounter.  Last Vitals:  Vitals:   11/20/22 1504 11/20/22 1510  BP: (!) 147/93 131/83  Pulse: 98 79  Resp: 17 (!) 23  Temp:    SpO2: 98% 97%    Last Pain:  Vitals:   11/20/22 1230  TempSrc:   PainSc: 0-No pain                 [name] L [name]

## 2022-11-21 ENCOUNTER — Encounter (HOSPITAL_COMMUNITY): Payer: Self-pay | Admitting: Cardiology

## 2022-11-21 ENCOUNTER — Other Ambulatory Visit (HOSPITAL_COMMUNITY): Payer: Self-pay

## 2022-12-18 ENCOUNTER — Encounter (HOSPITAL_COMMUNITY): Payer: Self-pay | Admitting: Physician Assistant

## 2022-12-18 ENCOUNTER — Ambulatory Visit (HOSPITAL_COMMUNITY): Payer: Medicare Other | Admitting: Physician Assistant

## 2022-12-18 ENCOUNTER — Ambulatory Visit (HOSPITAL_COMMUNITY)
Admission: RE | Admit: 2022-12-18 | Discharge: 2022-12-18 | Disposition: A | Discharge status: Home / Self Care | Payer: Medicare Other | Source: Ambulatory Visit | Attending: Physician Assistant | Admitting: Physician Assistant

## 2022-12-18 VITALS — BP 110/74 | HR 72 | Ht 73.0 in | Wt 157.0 lb

## 2022-12-18 DIAGNOSIS — I4892 Unspecified atrial flutter: Secondary | ICD-10-CM | POA: Diagnosis not present

## 2022-12-18 DIAGNOSIS — Z79899 Other long term (current) drug therapy: Secondary | ICD-10-CM | POA: Diagnosis not present

## 2022-12-18 DIAGNOSIS — I4819 Other persistent atrial fibrillation: Secondary | ICD-10-CM | POA: Diagnosis present

## 2022-12-18 DIAGNOSIS — Z7901 Long term (current) use of anticoagulants: Secondary | ICD-10-CM | POA: Insufficient documentation

## 2022-12-18 NOTE — Progress Notes (Signed)
Primary Care Physician: Marin Olp, MD Primary Cardiologist: none Primary Electrophysiologist: Dr Curt Bears Referring Physician: Dr Janeann Forehand Lonas John Barrett is a 72 y.o. male with a history of atrial flutter and atrial fibrillation who presents for follow up in the [facility].  The patient was initially diagnosed with atrial fibrillation after presenting to the emergency room 06/18/2022 with irregular heartbeat and palpitations. Seen by Dr Curt Bears 07/05/22 and scheduled for ablation. Patient is on Eliquis for a CHADS2VASC score of 1.  On follow up today, patient is s/p afib and flutter ablation with Dr Curt Bears on 11/20/22. He reports that he has done well overall since the ablation but has had 5 episodes of tachypalpitations, all lasting < 10 minutes. He denies chest pain, swallowing pain, or groin issues. He did have urinary retention post ablation and had to self-cath. He has a history of BPH, followed by Dr Diamantina Providence.   Today, he denies symptoms of chest pain, shortness of breath, orthopnea, PND, lower extremity edema, dizziness, presyncope, syncope, snoring, daytime somnolence, bleeding, or neurologic sequela. The patient is tolerating medications without difficulties and is otherwise without complaint today.    Atrial Fibrillation Risk Factors:  he does not have symptoms or diagnosis of sleep apnea. he does not have a history of rheumatic fever. he does have a history of alcohol use.   he has a BMI of Body mass index is 20.71 kg/m.Marland Kitchen Filed Weights   12/18/22 0927  Weight: 71.2 kg    Family History  Problem Relation Age of Onset   Other Mother        fall related at age 39 years old.  2010   Cancer Father    Colon polyps Father    Atrial fibrillation Father        coumadin. 64 and doing well   Heart murmur Father        unsure which valve but has valve issue   Other Maternal Grandmother        early 37s- "old age"   Other Maternal  Grandfather        unclear cause   Lung cancer Paternal Grandmother        smoker   Other Paternal Grandfather        unclear cause   Sudden death Neg Hx    Hypertension Neg Hx    Hyperlipidemia Neg Hx    Heart attack Neg Hx    Diabetes Neg Hx    Colon cancer Neg Hx    Rectal cancer Neg Hx    Stomach cancer Neg Hx      Atrial Fibrillation Management history:  Previous antiarrhythmic drugs: none Previous cardioversions: none Previous ablations: 11/20/22 CHADS2VASC score: 1 Anticoagulation history: Eliquis   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    History of adenomatous polyp of colon    q5 years   History of skin cancer    basal cell in past. Dr. Denna Haggard  arm, chest   Psoriasis    has had to use orals in the past. Mainly controlled with triamcinolone per Dr. Denna Haggard   Rosacea    hydrocortisone OTC per Dr. Denna Haggard   Shingles    x2   Soft tissue mass  left lower leg. removed by Dr. 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   Mid Back   Squamous cell carcinoma of skin 05/10/2020   SCC MID BACK CX3 5FU   Past Surgical History:  Procedure Laterality Date   APPENDECTOMY     ATRIAL FIBRILLATION ABLATION N/A 11/20/2022   Procedure: ATRIAL FIBRILLATION ABLATION;  Surgeon: Constance Haw, MD;  Location: [facility];  Service: Cardiovascular;  Laterality: N/A;   COLONOSCOPY  12/2014   Fuller Plan - TA polyps    HEMORRHOID BANDING  2008   MASS EXCISION Left 07/29/2014   Procedure: EXCISION SOFT TISSUE MASS LEFT LOWER LEG;  Surgeon: Armandina Gemma, MD;  Location: [facility];  Service: General;  Laterality: Left;   POLYPECTOMY     TONSILLECTOMY AND ADENOIDECTOMY     VEIN LIGATION     of penis. cialis in past for ED related to vein ligation/leak and BPH    Current Outpatient  Medications  Medication Sig Dispense Refill   alfuzosin (UROXATRAL) 10 MG 24 hr tablet Take 10 mg by mouth daily.     apixaban (ELIQUIS) 5 MG TABS tablet Take 1 tablet (5 mg total) by mouth 2 (two) times daily. 60 tablet 6   Clobetasol Prop Emollient Base 0.05 % emollient cream Apply topically 1- 2 (two) times a day if needed (not for face or skin folds) 60 g 6   diltiazem (CARDIZEM CD) 180 MG 24 hr capsule Take 1 capsule (180 mg total) by mouth daily. 30 capsule 6   Multiple Vitamin (MULTIVITAMIN) capsule Take 1 capsule by mouth daily. Men     Omega-3 Fatty Acids (FISH OIL) 1000 MG CAPS Take 1,000 mg by mouth daily in the afternoon.     Polyethyl Glycol-Propyl Glycol (SYSTANE ULTRA) 0.4-0.3 % SOLN Place 1 drop into both eyes daily as needed (dry eyes).     colchicine 0.6 MG tablet Take 1 tablet (0.6 mg total) by mouth 2 (two) times daily for 7 days. 14 tablet 0   No current facility-administered medications for this encounter.    No Known Allergies  Social History   Socioeconomic History   Marital status: Married    Spouse name: Not on file   Number of children: Not on file   Years of education: Not on file   Highest education level: Not on file  Occupational History   Not on file  Tobacco Use   Smoking status: Never   Smokeless tobacco: Never   Tobacco comments:    Never smoke 10/30/2022  Vaping Use   Vaping Use: Never used  Substance and Sexual Activity   Alcohol use: Not Currently    Alcohol/week: 1.0 - 3.0 standard drink of alcohol    Types: 1 - 3 Glasses of wine per week    Comment: stop drinking 05/2022   Drug use: No   Sexual activity: Not on file  Other Topics Concern   Not on file  Social History Narrative   Married 12 years in 2018. 2 grown children and 2 grandkids (57 and 77 year old sons in 2018- live in [place]- moving to [place] soon). One son lives [place].    Mother in law moved in with them- Parkinsons 59 years old- Dr. 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 Masters- at [employer].    Retired from [employer] previously  Hobbies: workout- yoga classes at [facility]- stationary bike. Light weight workout. Fair amount of travel- enjoys this.    Social Determinants of Health   Financial Resource Strain: Not on file  Food Insecurity: Not on file  Transportation Needs: Not on file  Physical Activity: Not on file  Stress: Not on file  Social Connections: Not on file  Intimate Partner Violence: Not on file     ROS- All systems are reviewed and negative except as per the HPI above.  Physical Exam: Vitals:   12/18/22 0927  BP: 110/74  Pulse: 72  Weight: 71.2 kg  Height: 6' 1"$  (1.854 m)     GEN- The patient is a well appearing male, alert and oriented x 3 today.   HEENT-head normocephalic, atraumatic, sclera clear, conjunctiva pink, hearing intact, trachea midline. Lungs- Clear to ausculation bilaterally, normal work of breathing Heart- Regular rate and rhythm, no murmurs, rubs or gallops  GI- soft, NT, ND, + BS Extremities- no clubbing, cyanosis, or edema MS- no significant deformity or atrophy Skin- no rash or lesion Psych- euthymic mood, full affect Neuro- strength and sensation are intact   Wt Readings from Last 3 Encounters:  12/18/22 71.2 kg  11/20/22 72.6 kg  10/30/22 72.2 kg    EKG today demonstrates  SR, PAC, NST Vent. rate 72 BPM PR interval 154 ms QRS duration 80 ms QT/QTcB 368/402 ms   Echo 06/19/22 demonstrated  Left Ventricle  Left ventricle size is normal. Wall thickness is normal. EF: 60-65%. Ejection fraction measured by 3D is 61%, which is normal. Wall motion is normal. Doppler parameters indicate normal diastolic function.  Right Ventricle  Grossly dilated. Normal tricuspid annular plane systolic excursion (TAPSE) >1.7 cm. Normal systolic excursion velocity by TDI (>9.5 cm/s).  Left Atrium  Left atrium size is normal. Atrial septum  appears intact.  Right Atrium  Grossly dilated right atrium.  IVC/SVC  The inferior vena cava demonstrates a diameter of <=2.1 cm and collapses <50%; therefore, the right atrial pressure is estimated at 8 mmHg.  Mitral Valve  The leaflets are moderately thickened. There is trace regurgitation.  Tricuspid Valve  Tricuspid valve structure is normal. There is mild to moderate regurgitation. The right ventricular systolic pressure is normal (<36 mmHg).  Aortic Valve  The aortic valve is tricuspid. There is no regurgitation or stenosis.  Pulmonic Valve  Pulmonic valve structure is normal. Trace regurgitation.  Ascending Aorta  The aortic root is normal in size.  Pericardium  There is no pericardial effusion.    Epic records are reviewed at length today  CHA2DS2-VASc Score = 1  The patient's score is based upon: CHF History: 0 HTN History: 0 Diabetes History: 0 Stroke History: 0 Vascular Disease History: 0 Age Score: 1 Gender Score: 0       ASSESSMENT AND PLAN: 1. Persistent Atrial Fibrillation/atrial flutter The patient's CHA2DS2-VASc score is 1, indicating a 0.6% annual risk of stroke.   S/p afib and flutter ablation 11/20/22. Patient appears to be maintaining SR with very brief palpitations.  Continue Eliquis 5 mg BID with no missed doses for 3 months post ablation.  Continue diltiazem 180 mg daily   Follow up with Dr Curt Bears as scheduled.    [facility] [address] [place], [place] [address] [phone] 12/18/2022 9:44 AM

## 2023-01-14 ENCOUNTER — Other Ambulatory Visit: Payer: Self-pay | Admitting: Cardiology

## 2023-02-03 ENCOUNTER — Encounter: Payer: Self-pay | Admitting: Gastroenterology

## 2023-02-17 ENCOUNTER — Ambulatory Visit: Payer: Medicare Other | Attending: Cardiology | Admitting: Cardiology

## 2023-02-17 ENCOUNTER — Encounter: Payer: Self-pay | Admitting: Cardiology

## 2023-02-17 VITALS — BP 110/74 | HR 58 | Ht 73.0 in | Wt 158.0 lb

## 2023-02-17 DIAGNOSIS — I4819 Other persistent atrial fibrillation: Secondary | ICD-10-CM

## 2023-02-17 DIAGNOSIS — Z01810 Encounter for preprocedural cardiovascular examination: Secondary | ICD-10-CM

## 2023-02-17 DIAGNOSIS — I483 Typical atrial flutter: Secondary | ICD-10-CM | POA: Diagnosis present

## 2023-02-17 DIAGNOSIS — I10 Essential (primary) hypertension: Secondary | ICD-10-CM | POA: Diagnosis present

## 2023-02-17 NOTE — Progress Notes (Signed)
Electrophysiology Office Note   Date:  02/17/2023   ID:  John Barrett, DOB 19-May-1951, MRN [id number]  PCP:  Shelva Majestic, MD  Cardiologist:   Primary Electrophysiologist:  Jalyn Dutta Jorja Loa, MD    Chief Complaint: AF   History of Present Illness: John Barrett is a 72 y.o. male who is being seen today for the evaluation of AF at the request of Shelva Majestic, MD. Presenting today for electrophysiology evaluation.  He presented to emergency room 06/18/2022 with an irregular heartbeat.  He was found at [facility] stumbling around the store for approximately an hour.  He could not find his car in the parking lot.  His wife was called he was taken to the emergency room.  He was oriented without complaint other than palpitations when presented to hospital.  He was admitted to drinking alcohol prior to going to [facility].  He did tell me that he was alcoholic.  He has been drinking heavily for the last year.  He is entered into our whole rehab.  He was back in atrial fibrillation with symptoms of weakness and fatigue.  He is now status post ablation 11/20/2022.  Today, denies symptoms of palpitations, chest pain, shortness of breath, orthopnea, PND, lower extremity edema, claudication, dizziness, presyncope, syncope, bleeding, or neurologic sequela. The patient is tolerating medications without difficulties.  Since being seen he has done well.  He has had no chest pain or shortness of breath.  He is able to all of his daily activities without restriction.  He has quite a bit more energy since his ablation.  Despite this, he does still have some energy drain.  He says that his prostate has been causing him his issues and he has plans for prostate surgery.   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    History of adenomatous  polyp of colon    q5 years   History of skin cancer    basal cell in past. Dr. Jorja Loa  arm, chest   Psoriasis    has had to use orals in the past. Mainly controlled with triamcinolone per Dr. Jorja Loa   Rosacea    hydrocortisone OTC per Dr. Jorja Loa   Shingles    x2   Soft tissue mass    left lower leg. removed by Dr. 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/10/2020   SCC MID BACK CX3 5FU   Past Surgical History:  Procedure Laterality Date   APPENDECTOMY     ATRIAL FIBRILLATION ABLATION N/A 11/20/2022   Procedure: ATRIAL FIBRILLATION ABLATION;  Surgeon: Regan Lemming, MD;  [facility];  Service: Cardiovascular;  Laterality: N/A;   COLONOSCOPY  12/2014   Russella Dar - TA polyps    HEMORRHOID BANDING  2008   MASS EXCISION Left 07/29/2014   Procedure: EXCISION SOFT TISSUE MASS LEFT LOWER LEG;  Surgeon: Darnell Level, MD;  Location: [facility];  Service: General;  Laterality: Left;   POLYPECTOMY     TONSILLECTOMY AND ADENOIDECTOMY     VEIN LIGATION     of penis. cialis in past for ED related to vein ligation/leak and BPH     Current Outpatient  Medications  Medication Sig Dispense Refill   alfuzosin (UROXATRAL) 10 MG 24 hr tablet Take 10 mg by mouth daily.     apixaban (ELIQUIS) 5 MG TABS tablet Take 1 tablet (5 mg total) by mouth 2 (two) times daily. 60 tablet 6   Clobetasol Prop Emollient Base 0.05 % emollient cream Apply topically 1- 2 (two) times a day if needed (not for face or skin folds) 60 g 6   Multiple Vitamin (MULTIVITAMIN) capsule Take 1 capsule by mouth daily. Men     Omega-3 Fatty Acids (FISH OIL) 1000 MG CAPS Take 1,000 mg by mouth daily in the afternoon.     Polyethyl Glycol-Propyl Glycol (SYSTANE ULTRA) 0.4-0.3 % SOLN Place 1 drop into both eyes daily as needed (dry eyes).     colchicine 0.6 MG tablet Take 1 tablet (0.6 mg total) by mouth 2 (two) times daily for 7 days. 14  tablet 0   No current facility-administered medications for this visit.    Allergies:   Patient has no known allergies.   Social History:  The patient  reports that he has never smoked. He has never used smokeless tobacco. He reports that he does not currently use alcohol after a past usage of about 1.0 - 3.0 standard drink of alcohol per week. He reports that he does not use drugs.   Family History:  The patient's family history includes Atrial fibrillation in his father; Cancer in his father; Colon polyps in his father; Heart murmur in his father; Lung cancer in his paternal grandmother; Other in his maternal grandfather, maternal grandmother, mother, and paternal grandfather.   ROS:  Please see the history of present illness.   Otherwise, review of systems is positive for none.   All other systems are reviewed and negative.   PHYSICAL EXAM: VS:  BP 110/74   Pulse (!) 58   Ht  (1.854 m)   Wt 158 lb (71.7 kg)   SpO2 99%   BMI 20.85 kg/m  , BMI Body mass index is 20.85 kg/m. GEN: Well nourished, well developed, in no acute distress  HEENT: normal  Neck: no JVD, carotid bruits, or masses Cardiac: RRR; no murmurs, rubs, or gallops,no edema  Respiratory:  clear to auscultation bilaterally, normal work of breathing GI: soft, nontender, nondistended, + BS MS: no deformity or atrophy  Skin: warm and dry Neuro:  Strength and sensation are intact Psych: euthymic mood, full affect  EKG:  EKG is ordered today. Personal review of the ekg ordered shows sinus rhythm, rate 58   Recent Labs: 10/30/2022: BUN 13; Creatinine, Ser 0.91; Hemoglobin 15.2; Platelets 181; Potassium 4.0; Sodium 138    Lipid Panel     Component Value Date/Time   CHOL 178 06/04/2017 0945   TRIG 53.0 06/04/2017 0945   HDL 64.40 06/04/2017 0945   CHOLHDL 3 06/04/2017 0945   VLDL 10.6 06/04/2017 0945   LDLCALC 103 (H) 06/04/2017 0945     Wt Readings from Last 3 Encounters:  02/17/23 158 lb (71.7 kg)   12/18/22 157 lb (71.2 kg)  11/20/22 160 lb (72.6 kg)      Other studies Reviewed: Additional studies/ records that were reviewed today include: TTE 06/19/22  Review of the above records today demonstrates:  Left Ventricle  Left ventricle size is normal. Wall thickness is normal. EF: 60-65%. Ejection fraction measured by 3D is 61%, which is normal. Wall motion is normal. Doppler parameters indicate normal diastolic function.   Right Ventricle  Grossly  dilated. Normal tricuspid annular plane systolic excursion (TAPSE) >1.7 cm. Normal systolic excursion velocity by TDI (>9.5 cm/s).   Left Atrium  Left atrium size is normal. Atrial septum appears intact.   Right Atrium  Grossly dilated right atrium.   IVC/SVC  The inferior vena cava demonstrates a diameter of <=2.1 cm and collapses <50%; therefore, the right atrial pressure is estimated at 8 mmHg.   Mitral Valve  The leaflets are moderately thickened. There is trace regurgitation.   Tricuspid Valve  Tricuspid valve structure is normal. There is mild to moderate regurgitation. The right ventricular systolic pressure is normal (<36 mmHg).   Aortic Valve  The aortic valve is tricuspid. There is no regurgitation or stenosis.   Pulmonic Valve  Pulmonic valve structure is normal. Trace regurgitation.   Ascending Aorta  The aortic root is normal in size.   Pericardium  There is no pericardial effusion.    ASSESSMENT AND PLAN:  1.  Persistent atrial fibrillation/flutter: Currently on Eliquis and diltiazem.  CHA2DS2-VASc of 1.  Is status post ablation 11/20/2022.  He is currently feeling well.  His energy and shortness of breath has improved.  Akiva Josey plan to stop his diltiazem today.  If he continues to do well without episodes of atrial fibrillation, would be able to potentially stop his Eliquis at his next visit due to his low stroke risk.  2.  Alcohol abuse: Has stopped drinking and is entered rehab.  3.  Preoperative evaluation:  Has plans for prostate surgery upcoming.  He has a calcium score of 0 and a normal ejection fraction.  Additionally, he is able to exercise without issue.  He would be a low to intermediate risk for this intermediate risk procedure.  No further cardiac testing is necessary.  He would be able to hold Eliquis for 3 days prior to the procedure.  Current medicines are reviewed at length with the patient today.   The patient does not have concerns regarding his medicines.  The following changes were made today: Stop diltiazem  Labs/ tests ordered today include:  Orders Placed This Encounter  Procedures   EKG 12-Lead     Disposition:   FU 6 months  Signed, Ples Trudel Jorja Loa, MD  02/17/2023 11:54 AM     [facility] [address] [place] [address] [phone] (office) [phone] (fax)

## 2023-02-17 NOTE — Patient Instructions (Addendum)
Medication Instructions:  Your physician has recommended you make the following change in your medication:  STOP Diltiazem  *If you need a refill on your cardiac medications before your next appointment, please call your pharmacy*   Lab Work: None ordered   Testing/Procedures: None ordered   Follow-Up: At [facility], you and your health needs are our priority.  As part of our continuing mission to provide you with exceptional heart care, we have created designated Provider Care Teams.  These Care Teams include your primary Cardiologist (physician) and Advanced Practice Providers (APPs -  Physician Assistants and Nurse Practitioners) who all work together to provide you with the care you need, when you need it.  Your next appointment:   6 month(s)  The format for your next appointment:   In Person  Provider:   You will see one of the following Advanced Practice Providers on your designated Care Team:   Francis Dowse, [place] Casimiro Needle "Otilio Saber, [place] {  Thank you for choosing [facility]!!   Dory Horn, RN [phone]

## 2023-02-19 ENCOUNTER — Other Ambulatory Visit: Payer: Self-pay | Admitting: Urology

## 2023-02-19 ENCOUNTER — Telehealth: Payer: Self-pay

## 2023-02-19 DIAGNOSIS — N138 Other obstructive and reflux uropathy: Secondary | ICD-10-CM

## 2023-02-19 NOTE — Telephone Encounter (Signed)
I spoke with John Barrett. We have discussed possible surgery dates and Friday May 17th, 2024 was agreed upon by all parties. Patient given information about surgery date, what to expect pre-operatively and post operatively.  We discussed that a Pre-Admission Testing office will be calling to set up the pre-op visit that will take place prior to surgery, and that these appointments are typically done over the phone with a Pre-Admissions RN. Informed patient that our office will communicate any additional care to be provided after surgery. Patients questions or concerns were discussed during our call. Advised to call our office should there be any additional information, questions or concerns that arise. Patient verbalized understanding.

## 2023-02-19 NOTE — Progress Notes (Signed)
Surgical Physician Order Form [facility]  * Scheduling expectation : Next Available  *Length of Case: 2 hours  *Clearance needed: no  *Anticoagulation Instructions: Hold all anticoagulants  *Aspirin Instructions: Hold Aspirin  *Post-op visit Date/Instructions:  1-3 day cath removal  *Diagnosis: BPH w/BOO  *Procedure:     HOLEP (52649)   Additional orders: N/A  -Admit type: OUTpatient  -Anesthesia: General  -VTE Prophylaxis Standing Order SCD's       Other:   -Standing Lab Orders Per Anesthesia    Lab other: UA&Urine Culture  -Standing Test orders EKG/Chest x-ray per Anesthesia       Test other:   - Medications:  Ancef 2gm IV  -Other orders:  N/A       

## 2023-02-19 NOTE — Progress Notes (Signed)
   [facility] Surgical Posting Form  Surgery Date: Date: 03/14/2023  Surgeon: Dr. 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

## 2023-02-26 ENCOUNTER — Other Ambulatory Visit: Payer: Medicare Other

## 2023-02-26 DIAGNOSIS — N401 Enlarged prostate with lower urinary tract symptoms: Secondary | ICD-10-CM

## 2023-02-26 LAB — URINALYSIS, COMPLETE
Bilirubin, UA: NEGATIVE
Glucose, UA: NEGATIVE
Ketones, UA: NEGATIVE
Nitrite, UA: NEGATIVE
Protein,UA: NEGATIVE
RBC, UA: NEGATIVE
Specific Gravity, UA: 1.015 (ref 1.005–1.030)
Urobilinogen, Ur: 2 mg/dL — ABNORMAL HIGH (ref 0.2–1.0)
pH, UA: 5.5 (ref 5.0–7.5)

## 2023-02-26 LAB — MICROSCOPIC EXAMINATION

## 2023-02-28 ENCOUNTER — Telehealth: Payer: Self-pay | Admitting: Urology

## 2023-02-28 NOTE — Telephone Encounter (Signed)
Called pt informed pt that there were few bacteria seen on his urinalysis however his urine culture is negative and is used as the definitive test for determining infection. Pt voiced understanding.

## 2023-02-28 NOTE — Telephone Encounter (Signed)
Patient lvm that he viewed his urine lab result and that there was bacteria in urine. He said he has surgery on 03/14/23, and asked if an antibiotic would need to be called in before. Please advise patient. [phone]

## 2023-03-02 LAB — CULTURE, URINE COMPREHENSIVE

## 2023-03-07 ENCOUNTER — Encounter
Admission: RE | Admit: 2023-03-07 | Discharge: 2023-03-07 | Disposition: A | Discharge status: Home / Self Care | Payer: Medicare Other | Source: Ambulatory Visit | Attending: Urology | Admitting: Urology

## 2023-03-07 HISTORY — DX: Restless legs syndrome: G25.81

## 2023-03-07 HISTORY — DX: Family history of other specified conditions: Z84.89

## 2023-03-07 HISTORY — DX: Alcohol abuse, in remission: F10.11

## 2023-03-07 HISTORY — DX: Benign prostatic hyperplasia with lower urinary tract symptoms: N40.1

## 2023-03-07 HISTORY — DX: Bilateral inguinal hernia, without obstruction or gangrene, recurrent: K40.21

## 2023-03-07 HISTORY — DX: Unspecified atrial fibrillation: I48.91

## 2023-03-07 NOTE — Patient Instructions (Signed)
Your procedure is scheduled on:03-14-23 Friday Report to the Registration Desk on the 1st floor of the [facility].Then proceed to the 2nd floor Surgery Desk To find out your arrival time, please call [phone] between 1PM - 3PM on:03-13-23 Thursday If your arrival time is 6:00 am, do not arrive before that time as the [facility] entrance doors do not open until 6:00 am.  REMEMBER: Instructions that are not followed completely may result in serious medical risk, up to and including death; or upon the discretion of your surgeon and anesthesiologist your surgery may need to be rescheduled.  Do not eat food OR drink any liquids after midnight the night before surgery.  No gum chewing or hard candies.  One week prior to surgery: Stop Anti-inflammatories (NSAIDS) such as Advil, Aleve, Ibuprofen, Motrin, Naproxen, Naprosyn and Aspirin based products such as Excedrin, Goody's Powder, BC Powder.You may however, take Tylenol if needed for pain up until the day of surgery. Stop ANY OVER THE COUNTER supplements/vitamins NOW (03-07-23) until after surgery (Multivitamin and Fish Oil)  Continue taking all prescribed medications with the exception of the following: -Stop your apixaban (ELIQUIS) 3 days prior to surgery-Last dose will be on 03-10-23 Monday  Do NOT take any medication the day of surgery  No Alcohol for 24 hours before or after surgery.  No Smoking including e-cigarettes for 24 hours before surgery.  No chewable tobacco products for at least 6 hours before surgery.  No nicotine patches on the day of surgery.  Do not use any "recreational" drugs for at least a week (preferably 2 weeks) before your surgery.  Please be advised that the combination of cocaine and anesthesia may have negative outcomes, up to and including death. If you test positive for cocaine, your surgery will be cancelled.  On the morning of surgery brush your teeth with toothpaste and water, you may rinse your mouth  with mouthwash if you wish. Do not swallow any toothpaste or mouthwash.  Do not wear jewelry, make-up, hairpins, clips or nail polish.  Do not wear lotions, powders, or perfumes.   Do not shave body hair from the neck down 48 hours before surgery.  Contact lenses, hearing aids and dentures may not be worn into surgery.  Do not bring valuables to the hospital. [facility] is not responsible for any missing/lost belongings or valuables. .   Notify your doctor if there is any change in your medical condition (cold, fever, infection).  Wear comfortable clothing (specific to your surgery type) to the hospital.  After surgery, you can help prevent lung complications by doing breathing exercises.  Take deep breaths and cough every 1-2 hours. Your doctor may order a device called an Incentive Spirometer to help you take deep breaths. When coughing or sneezing, hold a pillow firmly against your incision with both hands. This is called "splinting." Doing this helps protect your incision. It also decreases belly discomfort.  If you are being admitted to the hospital overnight, leave your suitcase in the car. After surgery it may be brought to your room.  In case of increased patient census, it may be necessary for you, the patient, to continue your postoperative care in the Same Day Surgery department.  If you are being discharged the day of surgery, you will not be allowed to drive home. You will need a responsible individual to drive you home and stay with you for 24 hours after surgery.   If you are taking public transportation, you will  need to have a responsible individual with you.  Please call the Pre-admissions Testing Dept. at [phone] if you have any questions about these instructions.  Surgery Visitation Policy:  Patients having surgery or a procedure may have two visitors.  Children under the age of 67 must have an adult with them who is not the patient. Marland Kitchen

## 2023-03-13 ENCOUNTER — Encounter: Payer: Self-pay | Admitting: Urology

## 2023-03-13 NOTE — Progress Notes (Signed)
Perioperative / Anesthesia Services  Pre-Admission Testing Clinical Review / Preoperative Anesthesia Consult  Date: 03/13/23  Patient Demographics:  Name: John Barrett DOB:   [birth year] MRN:   [id number]  Planned Surgical Procedure(s):    Case: [id number] Date/Time: 03/14/23 0715   Procedure: HOLEP-LASER ENUCLEATION OF THE PROSTATE WITH MORCELLATION   Anesthesia type: General   Pre-op diagnosis: Benign Prostatic Hyperplasia with Urinary Obstruction   Location: [facility] OR ROOM 10 / [facility] FOR AN[facility]   Surgeons: Sondra Come, MD     NOTE: Available PAT nursing documentation and vital signs have been reviewed. Clinical nursing staff has updated patient's PMH/PSHx, current medication list, and drug allergies/intolerances to ensure comprehensive history available to assist in medical decision making as it pertains to the aforementioned surgical procedure and anticipated anesthetic course. Extensive review of available clinical information personally performed. [name] PMH and PSHx updated with any diagnoses/procedures that  may have been inadvertently omitted during his intake with the pre-admission testing department's nursing staff.  Clinical Discussion:  John Barrett is a 72 y.o. male who is submitted for pre-surgical anesthesia review and clearance prior to him undergoing the above procedure. Patient has never been a smoker. Pertinent PMH includes: atrial fibrillation, ascending aorta dilatation, BPH, OA, RLS, alcohol abuse,  Patient is followed by cardiology Elberta Fortis, MD). He was last seen in the cardiology clinic on 02/17/2023; notes reviewed. At the time of his clinic visit, patient doing well overall from a cardiovascular perspective. Patient denied any chest pain, shortness of breath, PND, orthopnea, palpitations, significant peripheral edema, weakness, fatigue, vertiginous symptoms, or presyncope/syncope. Patient with a past medical history significant for  cardiovascular diagnoses. Documented physical exam was grossly benign, providing no evidence of acute exacerbation and/or decompensation of the patient's known cardiovascular conditions.  TTE performed on 06/19/2022 revealed a normal left ventricular systolic function with an EF of 60-65%.  Right ventricular size and function normal.  Right atrium was grossly dilated.  There was trace mitral and pulmonic, and mild to moderate tricuspid, regurgitation.  All transvalvular gradients were noted to be normal providing no evidence suggestive of valvular stenosis.    Cardiac CT performed on 11/13/2022 revealing no thrombus in the LEFT atrial appendage.  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  Ascending aorta was dilated at 40 mm.  Patient with a coronary calcium score of 0.  Patient with an atrial fibrillation diagnosis; CHA2DS2-VASc Score = 1 (age).patient is status post a successful cardiac ablation procedure on 11/20/2022.  Cardiac rate and rhythm were being maintained on oral diltiazem.  Patient presented to the office with sinus bradycardia at a rate of 58 bpm showing on his ECG, thus the decision was made to discontinue diltiazem therapy. He remains on daily oral anticoagulation therapy (apixaban) and is reportedly compliant with therapy with no evidence or reports of GI bleeding.  Cardiology noted that given patient's low CHA2DS2-VASc score, indicating low CVA risk, plans are to discontinue DOAC therapy during next visit.  Blood pressure well-controlled at 110/74 mmHg without the use of pharmacological intervention.  Patient takes an omega-3 fatty acid capsule daily for cardiovascular health/ASCVD prevention.  He is not diabetic. Patient does not have an OSAH diagnosis. Functional capacity, as defined by DASI, is documented as being >/= 4 METS.  Other than the discontinuation of his diltiazem, no other changes were made to his medication regimen.  Patient to follow-up with outpatient cardiology and 6 months or  sooner if needed.  John Barrett is  scheduled for an HOLEP-LASER ENUCLEATION OF THE PROSTATE WITH MORCELLATION on 03/14/2023 with Dr. Legrand Rams, MD.  Given patient's past medical history significant for cardiovascular diagnoses, presurgical cardiac clearance was sought by the PAT team.  Per cardiology, "he has a calcium score of 0 and a normal ejection fraction. Additionally, he is able to exercise without issue. He would be a LOW to INTERMEDIATE risk for this intermediate risk procedure. No further cardiac testing is necessary".   I again, patient is on daily oral anticoagulation therapy He has been instructed on recommendations for holding his apixaban for 3 days prior to his procedure with plans to restart as soon as postoperative bleeding risk felt to be minimized by his attending surgeon. The patient has been instructed that his last dose of his apixaban should be on 03/10/2023.  Patient denies previous perioperative complications with anesthesia in the past. In review of the available records, it is noted that patient underwent a general anesthetic course at [facility] ([facility] in 10/2022 without documented complications.      03/07/2023    4:00 PM 02/17/2023   11:31 AM 12/18/2022    9:27 AM  Vitals with BMI  Height 6\' 1"  6\' 1"  6\' 1"   Weight 158 lbs 12 oz 158 lbs 157 lbs  BMI 20.95 20.85 20.72  Systolic  110 110  Diastolic  74 74  Pulse  58 72    Providers/Specialists:   NOTE: Primary physician provider listed below. Patient may have been seen by APP or partner within same practice.   PROVIDER ROLE / SPECIALTY LAST Lise Auer, MD Urology (Surgeon) 02/19/2023  Shelva Majestic, MD Primary Care Provider 11/25/2020  Loman Brooklyn, MD Cardiology/Electrophysiology 02/17/2023   Allergies:  Patient has no known allergies.  Current Home Medications:   No current facility-administered medications for this encounter.    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    Hemorrhoids    a.) s/p banding   History of adenomatous polyp of colon    q5 years   History of skin cancer    basal cell in past. Dr. Jorja Loa  arm, chest   Inguinal hernia recurrent bilateral    Long term current use of anticoagulant    a.) apixaban   Osteoarthritis    Psoriasis    has had to use orals in the past. Mainly controlled with triamcinolone per Dr. Jorja Loa   RLS (  restless legs syndrome)    Rosacea    hydrocortisone OTC per Dr. Jorja Loa   Shingles    x2   Soft tissue mass    left lower leg. removed by Dr. 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/10/2020   SCC MID BACK CX3 5FU   Past Surgical History:  Procedure Laterality Date   APPENDECTOMY     ATRIAL FIBRILLATION ABLATION N/A 11/20/2022   Procedure: ATRIAL FIBRILLATION ABLATION;  Surgeon: Regan Lemming, MD;  Location: [facility];  Service: Cardiovascular;  Laterality: N/A;    COLONOSCOPY  12/2014   Russella Dar - TA polyps    HEMORRHOID BANDING  2008   in office   MASS EXCISION Left 07/29/2014   Procedure: EXCISION SOFT TISSUE MASS LEFT LOWER LEG;  Surgeon: Darnell Level, MD;  Location: [facility];  Service: General;  Laterality: Left;   POLYPECTOMY     TONSILLECTOMY AND ADENOIDECTOMY     VEIN LIGATION     of penis. cialis in past for ED related to vein ligation/leak and BPH x2   Family History  Problem Relation Age of Onset   Other Mother        fall related at age 59 years old.  2010   Cancer Father    Colon polyps Father    Atrial fibrillation Father        coumadin. 91 and doing well   Heart murmur Father        unsure which valve but has valve issue   Other Maternal Grandmother        early 70s- "old age"   Other Maternal Grandfather        unclear cause   Lung cancer Paternal Grandmother        smoker   Other Paternal Grandfather        unclear cause   Sudden death Neg Hx    Hypertension Neg Hx    Hyperlipidemia Neg Hx    Heart attack Neg Hx    Diabetes Neg Hx    Colon cancer Neg Hx    Rectal cancer Neg Hx    Stomach cancer Neg Hx    Social History   Tobacco Use   Smoking status: Never   Smokeless tobacco: Never   Tobacco comments:    Never smoke 10/30/2022  Vaping Use   Vaping Use: Never used  Substance Use Topics   Alcohol use: Not Currently    Alcohol/week: 1.0 - 3.0 standard drink of alcohol    Types: 1 - 3 Glasses of wine per week    Comment: stop drinking 05/2022   Drug use: No    Pertinent Clinical Results:  LABS:  Lab Results  Component Value Date   WBC 5.6 10/30/2022   HGB 15.2 10/30/2022   HCT 47.4 10/30/2022   MCV 93.9 10/30/2022   PLT 181 10/30/2022   Lab Results  Component Value Date   NA 138 10/30/2022   K 4.0 10/30/2022   CO2 25 10/30/2022   GLUCOSE 104 (H) 10/30/2022   BUN 13 10/30/2022   CREATININE 0.91 10/30/2022   CALCIUM 8.9 10/30/2022   GFRNONAA >60 10/30/2022   No visits with  results within 3 Day(s) from this visit.  Latest known visit with results is:  Appointment on 02/26/2023  Component Date Value Ref Range Status   Urine Culture, Comprehensive 02/26/2023 Final report  Final   Organism ID, Bacteria 02/26/2023 Comment   Final   No growth in 36 - 48 hours.   Specific Gravity, UA 02/26/2023 1.015  1.005 - 1.030 Final   pH, UA 02/26/2023 5.5  5.0 - 7.5 Final   Color, UA 02/26/2023 Yellow  Yellow Final   Appearance Ur 02/26/2023 Clear  Clear Final   Leukocytes,UA 02/26/2023 Trace (A)  Negative Final   Protein,UA 02/26/2023 Negative  Negative/Trace Final   Glucose, UA 02/26/2023 Negative  Negative Final   Ketones, UA 02/26/2023 Negative  Negative Final   RBC, UA 02/26/2023 Negative  Negative Final   Bilirubin, UA 02/26/2023 Negative  Negative Final   Urobilinogen, Ur 02/26/2023 2.0 (H)  0.2 - 1.0 mg/dL Final   Nitrite, UA 45/40/9811 Negative  Negative Final   Microscopic Examination 02/26/2023 See below:   Final   WBC, UA 02/26/2023 11-30 (A)  0 - 5 /hpf Final   RBC, Urine 02/26/2023 0-2  0 - 2 /hpf Final   Epithelial Cells (non renal) 02/26/2023 0-10  0 - 10 /hpf Final   Bacteria, UA 02/26/2023 Few  None seen/Few Final    ECG: Date: 02/17/2023 Time ECG obtained: 1137 AM Rate: 58 bpm Rhythm: sinus bradycardia Axis (leads I and aVF): Normal Intervals: PR 158 ms. QRS 86 ms. QTc 416 ms. ST segment and T wave changes: No evidence of acute ST segment elevation or depression Comparison: Previous tracing obtained on 12/18/2022 revealed sinus rhythm with PACs at a rate of 72 bpm; nonspecific ST abnormality present.     IMAGING / PROCEDURES: CT CARDIAC MORPH/PULM VEIN W/CM&W/O CA SCORE performed on 11/13/2022 No extracardiac findings Pulmonary vein measurements as reported. There is a small accessory right superior pulmonary vein, with ostium area measuring 0.5 cm There is no thrombus in the left atrial appendage. Esophagus courses posterior to ostium of  accessory right superior pulmonary vein Dilated main pulmonary artery measuring 31 mm Dilated ascending aorta measuring 40 mm  Coronary calcium score 0  MR PROSTATE W WO CONTRAST performed on 03/17/2021 Right-sided central gland 1.9 cm nodule with equivocal characteristics. PI-RADS(v2.1)-3. No peripheral zone suspicious findings or evidence of locally advanced/pelvic metastatic disease.  Impression and Plan:  John Barrett has been referred for pre-anesthesia review and clearance prior to him undergoing the planned anesthetic and procedural courses. Available labs, pertinent testing, and imaging results were personally reviewed by me in preparation for upcoming operative/procedural course. [facility] medical record has been updated following extensive record review and patient interview with PAT staff.   This patient has been appropriately cleared by cardiology with an overall ACCEPTABLE risk of significant perioperative cardiovascular complications. Based on clinical review performed today (03/13/23), barring any significant acute changes in the patient's overall condition, it is anticipated that he will be able to proceed with the planned surgical intervention. Any acute changes in clinical condition may necessitate his procedure being postponed and/or cancelled. Patient will meet with anesthesia team (MD and/or CRNA) on the day of his procedure for preoperative evaluation/assessment. Questions regarding anesthetic course will be fielded at that time.   Pre-surgical instructions were reviewed with the patient during his PAT appointment, and questions were fielded to satisfaction by PAT clinical staff. He has been instructed on which medications that he will need to hold prior to surgery, as well as the ones that have been deemed safe/appropriate to take on the day of his procedure. As part of the general education provided by PAT, patient made aware both verbally  and in writing, that he would  need to abstain from the use of any illegal substances during his perioperative course.  He was advised that failure to follow the provided instructions could necessitate case cancellation or result in serious perioperative complications up to and including death. Patient encouraged to contact PAT and/or his surgeon's office to discuss any questions or concerns that may arise prior to surgery; verbalized understanding.   Quentin Mulling, MSN, APRN, FNP-C, CEN [facility]  Peri-operative Services Nurse Practitioner Phone: [phone] Fax: [phone] 03/13/23 11:09 AM  NOTE: This note has been prepared using Dragon dictation software. Despite my best ability to proofread, there is always the potential that unintentional transcriptional errors may still occur from this process.

## 2023-03-14 ENCOUNTER — Encounter: Payer: Self-pay | Admitting: Urology

## 2023-03-14 ENCOUNTER — Ambulatory Visit
Admission: RE | Admit: 2023-03-14 | Discharge: 2023-03-14 | Disposition: A | Discharge status: Home / Self Care | Payer: Medicare Other | Attending: Urology | Admitting: Urology

## 2023-03-14 ENCOUNTER — Encounter
Admission: RE | Disposition: A | Discharge status: Home / Self Care | Payer: Self-pay | Source: Home / Self Care | Attending: Urology

## 2023-03-14 ENCOUNTER — Other Ambulatory Visit: Payer: Self-pay

## 2023-03-14 ENCOUNTER — Ambulatory Visit: Payer: Medicare Other | Admitting: Urgent Care

## 2023-03-14 DIAGNOSIS — R3915 Urgency of urination: Secondary | ICD-10-CM | POA: Insufficient documentation

## 2023-03-14 DIAGNOSIS — R351 Nocturia: Secondary | ICD-10-CM | POA: Insufficient documentation

## 2023-03-14 DIAGNOSIS — I4819 Other persistent atrial fibrillation: Secondary | ICD-10-CM | POA: Diagnosis not present

## 2023-03-14 DIAGNOSIS — Z7901 Long term (current) use of anticoagulants: Secondary | ICD-10-CM | POA: Insufficient documentation

## 2023-03-14 DIAGNOSIS — N138 Other obstructive and reflux uropathy: Secondary | ICD-10-CM | POA: Insufficient documentation

## 2023-03-14 DIAGNOSIS — R3912 Poor urinary stream: Secondary | ICD-10-CM | POA: Diagnosis not present

## 2023-03-14 DIAGNOSIS — N401 Enlarged prostate with lower urinary tract symptoms: Secondary | ICD-10-CM | POA: Diagnosis present

## 2023-03-14 DIAGNOSIS — R338 Other retention of urine: Secondary | ICD-10-CM | POA: Insufficient documentation

## 2023-03-14 DIAGNOSIS — M1990 Unspecified osteoarthritis, unspecified site: Secondary | ICD-10-CM | POA: Insufficient documentation

## 2023-03-14 DIAGNOSIS — R972 Elevated prostate specific antigen [PSA]: Secondary | ICD-10-CM | POA: Insufficient documentation

## 2023-03-14 DIAGNOSIS — Z01818 Encounter for other preprocedural examination: Secondary | ICD-10-CM

## 2023-03-14 DIAGNOSIS — R3914 Feeling of incomplete bladder emptying: Secondary | ICD-10-CM | POA: Insufficient documentation

## 2023-03-14 HISTORY — DX: Unspecified hemorrhoids: K64.9

## 2023-03-14 HISTORY — DX: Unspecified osteoarthritis, unspecified site: M19.90

## 2023-03-14 HISTORY — DX: Long term (current) use of anticoagulants: Z79.01

## 2023-03-14 HISTORY — PX: HOLEP-LASER ENUCLEATION OF THE PROSTATE WITH MORCELLATION: SHX6641

## 2023-03-14 HISTORY — DX: Alcohol abuse, uncomplicated: F10.10

## 2023-03-14 LAB — BASIC METABOLIC PANEL
Anion gap: 6 (ref 5–15)
BUN: 21 mg/dL (ref 8–23)
CO2: 27 mmol/L (ref 22–32)
Calcium: 8.5 mg/dL — ABNORMAL LOW (ref 8.9–10.3)
Chloride: 107 mmol/L (ref 98–111)
Creatinine, Ser: 0.82 mg/dL (ref 0.61–1.24)
GFR, Estimated: >60 mL/min (ref >60)
Glucose, Bld: 100 mg/dL — ABNORMAL HIGH (ref 70–99)
Potassium: 3.8 mmol/L (ref 3.5–5.1)
Sodium: 140 mmol/L (ref 135–145)

## 2023-03-14 LAB — CBC
HCT: 44.8 % (ref 39.0–52.0)
Hemoglobin: 14.6 g/dL (ref 13.0–17.0)
MCH: 29.7 pg (ref 26.0–34.0)
MCHC: 32.6 g/dL (ref 30.0–36.0)
MCV: 91.1 fL (ref 80.0–100.0)
Platelets: 170 10*3/uL (ref 150–400)
RBC: 4.92 MIL/uL (ref 4.22–5.81)
RDW: 13.8 % (ref 11.5–15.5)
WBC: 4.8 10*3/uL (ref 4.0–10.5)
nRBC: 0 % (ref 0.0–0.2)

## 2023-03-14 SURGERY — ENUCLEATION, PROSTATE, USING LASER, WITH MORCELLATION
Anesthesia: General

## 2023-03-14 MED ORDER — EPHEDRINE SULFATE (PRESSORS) 50 MG/ML IJ SOLN
INTRAMUSCULAR | Status: DC | PRN
  Administered 2023-03-14: 2.5 mg via INTRAVENOUS
  Administered 2023-03-14: 5 mg via INTRAVENOUS

## 2023-03-14 MED ORDER — DEXAMETHASONE SODIUM PHOSPHATE 10 MG/ML IJ SOLN
INTRAMUSCULAR | Status: DC | PRN
  Administered 2023-03-14: 5 mg via INTRAVENOUS

## 2023-03-14 MED ORDER — CEFAZOLIN SODIUM-DEXTROSE 2-4 GM/100ML-% IV SOLN
2.0000 g | INTRAVENOUS | Status: AC
  Administered 2023-03-14: 2 g via INTRAVENOUS

## 2023-03-14 MED ORDER — ACETAMINOPHEN 10 MG/ML IV SOLN
INTRAVENOUS | Status: DC | PRN
  Administered 2023-03-14: 1000 mg via INTRAVENOUS

## 2023-03-14 MED ORDER — ACETAMINOPHEN 10 MG/ML IV SOLN
INTRAVENOUS | Status: AC
  Filled 2023-03-14: qty 100

## 2023-03-14 MED ORDER — ACETAMINOPHEN 10 MG/ML IV SOLN: 1000.0000 mg | Freq: Once | INTRAVENOUS | Status: DC | PRN

## 2023-03-14 MED ORDER — LIDOCAINE HCL (CARDIAC) PF 100 MG/5ML IV SOSY
PREFILLED_SYRINGE | INTRAVENOUS | Status: DC | PRN
  Administered 2023-03-14: 100 mg via INTRAVENOUS

## 2023-03-14 MED ORDER — PROPOFOL 10 MG/ML IV BOLUS
INTRAVENOUS | Status: DC | PRN
  Administered 2023-03-14: 150 mg via INTRAVENOUS
  Administered 2023-03-14: 30 mg via INTRAVENOUS

## 2023-03-14 MED ORDER — PHENYLEPHRINE 80 MCG/ML (10ML) SYRINGE FOR IV PUSH (FOR BLOOD PRESSURE SUPPORT)
PREFILLED_SYRINGE | INTRAVENOUS | Status: DC | PRN
  Administered 2023-03-14 (×2): 40 ug via INTRAVENOUS
  Administered 2023-03-14: 80 ug via INTRAVENOUS
  Administered 2023-03-14: 40 ug via INTRAVENOUS

## 2023-03-14 MED ORDER — SODIUM CHLORIDE 0.9 % IR SOLN
Status: DC | PRN
  Administered 2023-03-14: 45000 mL via INTRAVESICAL

## 2023-03-14 MED ORDER — ORAL CARE MOUTH RINSE: 15.0000 mL | Freq: Once | OROMUCOSAL | Status: AC

## 2023-03-14 MED ORDER — MIDAZOLAM HCL 2 MG/2ML IJ SOLN
INTRAMUSCULAR | Status: AC
  Filled 2023-03-14: qty 2

## 2023-03-14 MED ORDER — SUGAMMADEX SODIUM 200 MG/2ML IV SOLN
INTRAVENOUS | Status: DC | PRN
  Administered 2023-03-14: 200 mg via INTRAVENOUS

## 2023-03-14 MED ORDER — CHLORHEXIDINE GLUCONATE 0.12 % MT SOLN
OROMUCOSAL | Status: AC
  Filled 2023-03-14: qty 15

## 2023-03-14 MED ORDER — ONDANSETRON HCL 4 MG/2ML IJ SOLN: 4.0000 mg | Freq: Once | INTRAMUSCULAR | Status: DC | PRN

## 2023-03-14 MED ORDER — LACTATED RINGERS IV SOLN: INTRAVENOUS | Status: DC

## 2023-03-14 MED ORDER — OXYCODONE HCL 5 MG/5ML PO SOLN: 5.0000 mg | Freq: Once | ORAL | Status: DC | PRN

## 2023-03-14 MED ORDER — ROCURONIUM BROMIDE 100 MG/10ML IV SOLN
INTRAVENOUS | Status: DC | PRN
  Administered 2023-03-14 (×2): 20 mg via INTRAVENOUS
  Administered 2023-03-14: 50 mg via INTRAVENOUS

## 2023-03-14 MED ORDER — OXYCODONE HCL 5 MG PO TABS: 5.0000 mg | ORAL_TABLET | Freq: Once | ORAL | Status: DC | PRN

## 2023-03-14 MED ORDER — ONDANSETRON HCL 4 MG/2ML IJ SOLN
INTRAMUSCULAR | Status: DC | PRN
  Administered 2023-03-14: 4 mg via INTRAVENOUS

## 2023-03-14 MED ORDER — FENTANYL CITRATE (PF) 100 MCG/2ML IJ SOLN: 25.0000 ug | INTRAMUSCULAR | Status: DC | PRN

## 2023-03-14 MED ORDER — FAMOTIDINE 20 MG PO TABS
ORAL_TABLET | ORAL | Status: AC
  Filled 2023-03-14: qty 1

## 2023-03-14 MED ORDER — FENTANYL CITRATE (PF) 100 MCG/2ML IJ SOLN
INTRAMUSCULAR | Status: DC | PRN
  Administered 2023-03-14: 50 ug via INTRAVENOUS
  Administered 2023-03-14: 25 ug via INTRAVENOUS

## 2023-03-14 MED ORDER — FAMOTIDINE 20 MG PO TABS
20.0000 mg | ORAL_TABLET | Freq: Once | ORAL | Status: AC
  Administered 2023-03-14: 20 mg via ORAL

## 2023-03-14 MED ORDER — FENTANYL CITRATE (PF) 100 MCG/2ML IJ SOLN
INTRAMUSCULAR | Status: AC
  Filled 2023-03-14: qty 2

## 2023-03-14 MED ORDER — CEFAZOLIN SODIUM-DEXTROSE 2-4 GM/100ML-% IV SOLN
INTRAVENOUS | Status: AC
  Filled 2023-03-14: qty 100

## 2023-03-14 MED ORDER — CHLORHEXIDINE GLUCONATE 0.12 % MT SOLN
15.0000 mL | Freq: Once | OROMUCOSAL | Status: AC
  Administered 2023-03-14: 15 mL via OROMUCOSAL

## 2023-03-14 MED ORDER — HYDROCODONE-ACETAMINOPHEN 5-325 MG PO TABS: 1.0000 | ORAL_TABLET | Freq: Four times a day (QID) | ORAL | 0 refills | Status: AC | PRN

## 2023-03-14 MED ORDER — PHENYLEPHRINE 80 MCG/ML (10ML) SYRINGE FOR IV PUSH (FOR BLOOD PRESSURE SUPPORT)
PREFILLED_SYRINGE | INTRAVENOUS | Status: AC
  Filled 2023-03-14: qty 10

## 2023-03-14 SURGICAL SUPPLY — 35 items
ADAPTER IRRIG TUBE 2 SPIKE SOL (ADAPTER) ×2 IMPLANT
ADPR TBG 2 SPK PMP STRL ASCP (ADAPTER) ×3
BAG DRN LRG CPC RND TRDRP CNTR (MISCELLANEOUS) ×1
BAG URO DRAIN 4000ML (MISCELLANEOUS) ×1 IMPLANT
CATH FOLEY 3WAY 30CC 24FR (CATHETERS) ×1
CATH URETL OPEN END 4X70 (CATHETERS) ×1 IMPLANT
CATH URTH STD 24FR FL 3W 2 (CATHETERS) ×1 IMPLANT
CONTAINER COLLECT MORCELLATR (MISCELLANEOUS) ×1 IMPLANT
DRAPE UTILITY 15X26 TOWEL STRL (DRAPES) IMPLANT
ELECT BIVAP BIPO 22/24 DONUT (ELECTROSURGICAL) ×1
ELECTRD BIVAP BIPO 22/24 DONUT (ELECTROSURGICAL) IMPLANT
FIBER LASER MOSES 550 DFL (Laser) ×1 IMPLANT
FILTER OVERFLOW MORCELLATOR (FILTER) ×1 IMPLANT
GLOVE BIOGEL PI IND STRL 7.5 (GLOVE) ×1 IMPLANT
GOWN STRL REUS W/ TWL LRG LVL3 (GOWN DISPOSABLE) ×1 IMPLANT
GOWN STRL REUS W/ TWL XL LVL3 (GOWN DISPOSABLE) ×1 IMPLANT
GOWN STRL REUS W/TWL LRG LVL3 (GOWN DISPOSABLE) ×1
GOWN STRL REUS W/TWL XL LVL3 (GOWN DISPOSABLE) ×1
HOLDER FOLEY CATH W/STRAP (MISCELLANEOUS) ×1 IMPLANT
IV NS IRRIG 3000ML ARTHROMATIC (IV SOLUTION) ×5 IMPLANT
KIT TURNOVER CYSTO (KITS) ×1 IMPLANT
MBRN O SEALING YLW 17 FOR INST (MISCELLANEOUS) ×1
MEMBRANE SLNG YLW 17 FOR INST (MISCELLANEOUS) ×1 IMPLANT
MORCELLATOR COLLECT CONTAINER (MISCELLANEOUS) ×1
MORCELLATOR OVERFLOW FILTER (FILTER) ×1
MORCELLATOR ROTATION 4.75 335 (MISCELLANEOUS) ×1 IMPLANT
PACK CYSTO AR (MISCELLANEOUS) ×1 IMPLANT
SET CYSTO W/LG BORE CLAMP LF (SET/KITS/TRAYS/PACK) ×1 IMPLANT
SET IRRIG Y TYPE TUR BLADDER L (SET/KITS/TRAYS/PACK) ×1 IMPLANT
SLEEVE PROTECTION STRL DISP (MISCELLANEOUS) ×2 IMPLANT
SURGILUBE 2OZ TUBE FLIPTOP (MISCELLANEOUS) ×1 IMPLANT
SYR TOOMEY IRRIG 70ML (MISCELLANEOUS) ×1
SYRINGE TOOMEY IRRIG 70ML (MISCELLANEOUS) ×1 IMPLANT
TUBE PUMP MORCELLATOR PIRANHA (TUBING) ×1 IMPLANT
WATER STERILE IRR 1000ML POUR (IV SOLUTION) ×1 IMPLANT

## 2023-03-14 NOTE — Op Note (Signed)
Date of procedure: 03/14/23  Preoperative diagnosis:  BPH with obstruction  Postoperative diagnosis:  Same  Procedure: HoLEP (Holmium Laser Enucleation of the Prostate)  Surgeon: Legrand Rams, MD  Anesthesia: General  Complications: None  Intraoperative findings:  Massive prostate with obstructing lateral lobes, mild to moderate bladder trabeculations, small diverticulum, no suspicious lesions Good hemostasis, ureteral orifices and verumontanum intact at conclusion of case  EBL: Minimal  Specimens: Prostate chips  Enucleation time: 38 minutes  Morcellation time: 17 minutes  Intra-op weight: 106g  Drains: 24 French three-way, 60 cc in balloon  Indication: John Barrett is a 72 y.o. patient with BPH and 130 g prostate on MRI from 2022 with persistent obstructive urinary symptoms, history of retention, and intermittent catheterization.  After reviewing the management options for treatment, they elected to proceed with the above surgical procedure(s). We have discussed the potential benefits and risks of the procedure, side effects of the proposed treatment, the likelihood of the patient achieving the goals of the procedure, and any potential problems that might occur during the procedure or recuperation.  We specifically discussed the risks of bleeding, infection, hematuria and clot retention, need for additional procedures, possible overnight hospital stay, temporary urgency and incontinence, rare long-term incontinence, and retrograde ejaculation.  Informed consent has been obtained.   Description of procedure:  The patient was taken to the operating room and general anesthesia was induced.  The patient was placed in the dorsal lithotomy position, prepped and draped in the usual sterile fashion, and preoperative antibiotics(Ancef) were administered.  SCDs were placed for DVT prophylaxis.  A preoperative time-out was performed.   John Barrett sounds were used to gently  dilated the urethra up to 50F. The 12 French continuous flow resectoscope was inserted into the urethra using the visual obturator  The prostate was massive with a high bladder neck and obstructing lateral lobes. The bladder was thoroughly inspected and moderate trabeculations, small diverticula, no suspicious lesions.  The ureteral orifices were located in orthotopic position.    The laser was set to 2 J and 60 Hz and early apical release was performed by making a circumferential mucosal incision proximal to the sphincter.  A lambda incision was then made proximal to the verumontanum.  The prostate was enucleated en bloc circumferentially into the bladder.  The capsule was examined and laser was used for meticulous hemostasis.    The 51 French resectoscope was then switched out for the 26 French nephroscope and prostate tissue was morcellated(Piranha) and the tissue sent to pathology.  A 24 French three-way catheter was inserted easily with the aid of a catheter guide, and 60 cc were placed in the balloon.  Urine was red.  Despite irrigation, urine remained darker than I expected.  I opted to remove the catheter, and the bladder was reentered using the resectoscope with bipolar button.  Meticulous hemostasis was achieved especially at the 10 to 2 o'clock position just inside the bladder where there appeared to be some bleeding vessels.  With low flow, no additional bleeding was noted.  Circumferential inspection of the prostatic fossa showed no other bleeding vessels.  A catheter guide was used to advance the catheter into the bladder with return of pink urine.  60 cc were placed in the balloon, the catheter irrigated easily and remained faint pink.  CBI was initiated.   The patient tolerated the procedure well without any immediate complications and was extubated and transferred to the recovery room in stable condition.  Urine was  faint pink on fast CBI.  Disposition: Stable to PACU  Plan: Wean  CBI in PACU, anticipate discharge home today with Foley removal in clinic in 2-3 days Can resume Eliquis Wednesday 5/22 if urine clear to pink  Legrand Rams, MD 03/14/2023

## 2023-03-14 NOTE — H&P (Signed)
03/14/23 7:02 AM   John Barrett [birth year] [id number]  CC: BPH  HPI: 72 year old male with long history of obstructive urinary symptoms as well as mildly elevated PSA. He has been followed by Dr. Retta Diones. Prostate MRI in May 2022 showed a 130 g prostate with only a central PI-RADS 3 lesion, biopsy showed only benign prostate tissue. He has had worsening urinary symptoms despite alfuzosin with weak stream, difficulty initiating stream, frequency, and urgency as well as nocturia 4-6 times at night. He actually developed urinary retention in early November 2023 and a Foley catheter was placed. Since that time he has seen Dr. Retta Diones who taught him intermittent catheterization, and currently he is performing that every 3 to 4 days, with residual volumes of less than 100 now. He previously was on finasteride as well. He is interested in considering outlet procedures.    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    Hemorrhoids    a.) s/p banding   History of adenomatous polyp of colon    q5 years   History of skin cancer    basal cell in past. Dr. Jorja Loa  arm, chest   Inguinal hernia recurrent bilateral    Long term current use of anticoagulant    a.) apixaban   Osteoarthritis    Psoriasis    has had to use orals in the  past. Mainly controlled with triamcinolone per Dr. Jorja Loa   RLS (restless legs syndrome)    Rosacea    hydrocortisone OTC per Dr. Jorja Loa   Shingles    x2   Soft tissue mass    left lower leg. removed by Dr. 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/10/2020   SCC MID BACK CX3 5FU    Surgical History: Past Surgical History:  Procedure Laterality Date   APPENDECTOMY     ATRIAL FIBRILLATION ABLATION N/A 11/20/2022   Procedure: ATRIAL FIBRILLATION ABLATION;  Surgeon: Regan Lemming, MD;  Location: [facility];  Service: Cardiovascular;  Laterality: N/A;   COLONOSCOPY  12/2014   Russella Dar - TA polyps    HEMORRHOID BANDING  2008   in office   MASS EXCISION Left 07/29/2014   Procedure: EXCISION SOFT TISSUE MASS LEFT LOWER LEG;  Surgeon: Darnell Level, MD;  Location: [facility];  Service: General;  Laterality: Left;   POLYPECTOMY     TONSILLECTOMY AND ADENOIDECTOMY     VEIN LIGATION     of penis. cialis in past for ED related to vein ligation/leak and BPH x2    Family History: Family History  Problem Relation Age of Onset   Other Mother        fall  related at age 30 years old.  2010   Cancer Father    Colon polyps Father    Atrial fibrillation Father        coumadin. 91 and doing well   Heart murmur Father        unsure which valve but has valve issue   Other Maternal Grandmother        early 68s- "old age"   Other Maternal Grandfather        unclear cause   Lung cancer Paternal Grandmother        smoker   Other Paternal Grandfather        unclear cause   Sudden death Neg Hx    Hypertension Neg Hx    Hyperlipidemia Neg Hx    Heart attack Neg Hx    Diabetes Neg Hx    Colon cancer Neg Hx    Rectal cancer Neg Hx    Stomach cancer Neg Hx     Social History:  reports that he has never smoked. He has never used smokeless tobacco. He reports that he does not currently use alcohol after  a past usage of about 1.0 - 3.0 standard drink of alcohol per week. He reports that he does not use drugs.  Physical Exam: BP 131/78   Pulse 69   Temp 97.8 F (36.6 C) (Temporal)   Resp 16   Ht 6\' 1"  (1.854 m)   Wt 72 kg   SpO2 99%   BMI 20.94 kg/m    Constitutional:  Alert and oriented, No acute distress. Cardiovascular: Regular rate and rhythm Respiratory: Clear to auscultation bilaterally GI: Abdomen is soft, nontender, nondistended, no abdominal masses   Laboratory Data: Urine culture 02/26/2023 no growth  Assessment & Plan:   72 year old male with BPH and 130 g prostate, persistent BPH symptoms with incomplete emptying, prior history of retention, currently performing intermittent CIC for bladder management.  We discussed the risks and benefits of HoLEP at length.  The procedure requires general anesthesia and takes 1 to 2 hours, and a holmium laser is used to enucleate the prostate and push this tissue into the bladder.  A morcellator is then used to remove this tissue, which is sent for pathology.  The vast majority(>95%) of patients are able to discharge the same day with a catheter in place for 2 to 3 days, and will follow-up in clinic for a voiding trial.  We specifically discussed the risks of bleeding, infection, retrograde ejaculation, temporary urgency and urge incontinence, very low risk of long-term incontinence, urethral stricture/bladder neck contracture, pathologic evaluation of prostate tissue and possible detection of prostate cancer or other malignancy, and possible need for additional procedures.  HOLEP today  Legrand Rams, MD 03/14/2023  [facility] [address] [place], [place] [address] [phone]

## 2023-03-14 NOTE — Discharge Instructions (Addendum)

## 2023-03-14 NOTE — Transfer of Care (Signed)
Immediate Anesthesia Transfer of Care Note  Patient: John Barrett  Procedure(s) Performed: HOLEP-LASER ENUCLEATION OF THE PROSTATE WITH MORCELLATION  Patient Location: PACU  Anesthesia Type:General  Level of Consciousness: drowsy  Airway & Oxygen Therapy: Patient Spontanous Breathing  Post-op Assessment: Report given to RN and Post -op Vital signs reviewed and stable  Post vital signs: Reviewed and stable  Last Vitals:  Vitals Value Taken Time  BP 124/75 03/14/23 0930  Temp    Pulse 58 03/14/23 0930  Resp 16 03/14/23 0930  SpO2 96 % 03/14/23 0930  Vitals shown include unvalidated device data.  Last Pain:  Vitals:   03/14/23 0627  TempSrc: Temporal  PainSc: 0-No pain         Complications: No notable events documented.

## 2023-03-14 NOTE — Anesthesia Postprocedure Evaluation (Signed)
Anesthesia Post Note  Patient: John Barrett  Procedure(s) Performed: HOLEP-LASER ENUCLEATION OF THE PROSTATE WITH MORCELLATION  Patient location during evaluation: PACU Anesthesia Type: General Level of consciousness: awake and alert, oriented and patient cooperative Pain management: pain level controlled Vital Signs Assessment: post-procedure vital signs reviewed and stable Respiratory status: spontaneous breathing, nonlabored ventilation and respiratory function stable Cardiovascular status: blood pressure returned to baseline and stable Postop Assessment: adequate PO intake Anesthetic complications: no   No notable events documented.   Last Vitals:  Vitals:   03/14/23 0945 03/14/23 1000  BP: 115/74 125/80  Pulse: (!) 56 (!) 53  Resp: 14 10  Temp:  (!) 36.3 C  SpO2: 95% 99%    Last Pain:  Vitals:   03/14/23 1000  TempSrc:   PainSc: 3                  Reed Breech

## 2023-03-14 NOTE — Anesthesia Preprocedure Evaluation (Addendum)
Anesthesia Evaluation  Patient identified by MRN, date of birth, ID band Patient awake    Reviewed: Allergy & Precautions, NPO status , Patient's Chart, lab work & pertinent test results  History of Anesthesia Complications Negative for: history of anesthetic complications  Airway Mallampati: III   Neck ROM: Full    Dental no notable dental hx.    Pulmonary neg pulmonary ROS   Pulmonary exam normal breath sounds clear to auscultation       Cardiovascular + dysrhythmias (a fib s/p ablation on Eliquis)  Rhythm:Regular Rate:Bradycardia  ECG 02/17/23: Sinus bradycardia (HR 58), otherwise normal  Echo 06/19/22:  Left Ventricle: EF: 60-65%. Ejection fraction measured by 3D is 61%, which is normal.    Right Ventricle: Normal systolic excursion velocity by TDI (>9.5 cm/s).    Left Atrium: Left atrium size is normal.    Neuro/Psych Alcohol use disorder, none since August 2023    GI/Hepatic negative GI ROS,,,  Endo/Other  negative endocrine ROS    Renal/GU negative Renal ROS   BPH    Musculoskeletal  (+) Arthritis ,    Abdominal   Peds  Hematology negative hematology ROS (+)   Anesthesia Other Findings Reviewed and agree with Edd Fabian pre-anesthesia clinical review note.    Cardiology note 02/17/23:  1.  Persistent atrial fibrillation/flutter: Currently on Eliquis and diltiazem.  CHA2DS2-VASc of 1.  Is status post ablation 11/20/2022.  He is currently feeling well.  His energy and shortness of breath has improved.  Will plan to stop his diltiazem today.  If he continues to do well without episodes of atrial fibrillation, would be able to potentially stop his Eliquis at his next visit due to his low stroke risk.   2.  Alcohol abuse: Has stopped drinking and is entered rehab.   3.  Preoperative evaluation: Has plans for prostate surgery upcoming.  He has a calcium score of 0 and a normal ejection fraction.   Additionally, he is able to exercise without issue.  He would be a low to intermediate risk for this intermediate risk procedure.  No further cardiac testing is necessary.  He would be able to hold Eliquis for 3 days prior to the procedure.   Reproductive/Obstetrics                             Anesthesia Physical Anesthesia Plan  ASA: 3  Anesthesia Plan: General   Post-op Pain Management:    Induction: Intravenous  PONV Risk Score and Plan: 2 and Ondansetron, Dexamethasone and Treatment may vary due to age or medical condition  Airway Management Planned: Oral ETT  Additional Equipment:   Intra-op Plan:   Post-operative Plan: Extubation in OR  Informed Consent: I have reviewed the patients History and Physical, chart, labs and discussed the procedure including the risks, benefits and alternatives for the proposed anesthesia with the patient or authorized representative who has indicated his/her understanding and acceptance.     Dental advisory given  Plan Discussed with: CRNA  Anesthesia Plan Comments: (Patient consented for risks of anesthesia including but not limited to:  - adverse reactions to medications - damage to eyes, teeth, lips or other oral mucosa - nerve damage due to positioning  - sore throat or hoarseness - damage to heart, brain, nerves, lungs, other parts of body or loss of life  Informed patient about role of CRNA in peri- and intra-operative care.  Patient voiced understanding.)  Anesthesia Quick Evaluation

## 2023-03-14 NOTE — Anesthesia Procedure Notes (Signed)
Procedure Name: Intubation Date/Time: 03/14/2023 7:33 AM  Performed by: Lynden Oxford, CRNAPre-anesthesia Checklist: Patient identified, Emergency Drugs available, Suction available and Patient being monitored Patient Re-evaluated:Patient Re-evaluated prior to induction Oxygen Delivery Method: Circle system utilized Preoxygenation: Pre-oxygenation with 100% oxygen Induction Type: IV induction Ventilation: Mask ventilation without difficulty Laryngoscope Size: McGraph and 4 Grade View: Grade II Tube type: Oral Tube size: 7.0 mm Number of attempts: 1 Airway Equipment and Method: Stylet and Video-laryngoscopy Placement Confirmation: ETT inserted through vocal cords under direct vision, positive ETCO2 and breath sounds checked- equal and bilateral Secured at: 22 cm Tube secured with: Tape Dental Injury: Teeth and Oropharynx as per pre-operative assessment

## 2023-03-15 ENCOUNTER — Encounter: Payer: Self-pay | Admitting: Urology

## 2023-03-17 ENCOUNTER — Ambulatory Visit (INDEPENDENT_AMBULATORY_CARE_PROVIDER_SITE_OTHER): Payer: Medicare Other | Admitting: Physician Assistant

## 2023-03-17 ENCOUNTER — Encounter: Payer: Self-pay | Admitting: Physician Assistant

## 2023-03-17 VITALS — BP 115/73 | HR 64 | Ht 73.0 in | Wt 166.0 lb

## 2023-03-17 DIAGNOSIS — N138 Other obstructive and reflux uropathy: Secondary | ICD-10-CM

## 2023-03-17 DIAGNOSIS — N401 Enlarged prostate with lower urinary tract symptoms: Secondary | ICD-10-CM

## 2023-03-17 NOTE — Patient Instructions (Signed)

## 2023-03-17 NOTE — Progress Notes (Signed)
Catheter Removal  Patient is present today for a catheter removal.  59ml of water was drained from the balloon. A 24FR three-way foley cath was removed from the bladder, no complications were noted. Patient tolerated well.  Performed by: Carman Ching, PA-C   Additional notes: Counseled patient on normal postoperative findings including dysuria, gross hematuria, and urinary urgency/leakage. Counseled patient to begin Kegel exercises 3x10 sets daily to increase urinary control and wear absorbent products as needed for security. Written and verbal resources provided today. Surgical pathology pending; will contact with results when available.  Follow up: Return in about 12 weeks (around 06/09/2023) for Postop f/u with Dr. Richardo Hanks.

## 2023-03-19 LAB — SURGICAL PATHOLOGY

## 2023-03-22 ENCOUNTER — Other Ambulatory Visit: Payer: Self-pay | Admitting: Cardiology

## 2023-03-25 NOTE — Telephone Encounter (Signed)
Prescription refill request for Eliquis received. Indication:afib Last office visit:4/24 Scr:0.8  5/24 Age: 72 Weight:75.3  kg  Prescription refilled

## 2023-04-16 ENCOUNTER — Encounter: Payer: Self-pay | Admitting: Gastroenterology

## 2023-04-26 ENCOUNTER — Ambulatory Visit
Admission: RE | Admit: 2023-04-26 | Discharge: 2023-04-26 | Disposition: A | Discharge status: Home / Self Care | Payer: Medicare Other | Source: Ambulatory Visit | Attending: Family Medicine | Admitting: Family Medicine

## 2023-04-26 ENCOUNTER — Other Ambulatory Visit: Payer: Self-pay

## 2023-04-26 VITALS — BP 125/81 | HR 67 | Temp 98.7°F | Resp 16 | Ht 73.0 in | Wt 157.0 lb

## 2023-04-26 DIAGNOSIS — S1096XA Insect bite of unspecified part of neck, initial encounter: Secondary | ICD-10-CM | POA: Diagnosis not present

## 2023-04-26 DIAGNOSIS — W57XXXA Bitten or stung by nonvenomous insect and other nonvenomous arthropods, initial encounter: Secondary | ICD-10-CM | POA: Diagnosis not present

## 2023-04-26 DIAGNOSIS — L03221 Cellulitis of neck: Secondary | ICD-10-CM

## 2023-04-26 MED ORDER — DOXYCYCLINE HYCLATE 100 MG PO CAPS: 100.0000 mg | ORAL_CAPSULE | Freq: Two times a day (BID) | ORAL | 0 refills | Status: AC

## 2023-04-26 NOTE — ED Triage Notes (Signed)
Pt states he was in the yard on Tuesday  & wife noticed a tick imbedded on the right side of his neck at 1230 today  Tick was removed in pieces Pt wants to be sure it is  completely out Cardiac Ablation  & Prostate surgery this year - pt states he wants to be sure he is not gong to get sick

## 2023-04-26 NOTE — ED Provider Notes (Signed)
[facility]    CSN: [id number] Arrival date & time: 04/26/23  1408      History   Chief Complaint Chief Complaint  Patient presents with   Insect Bite    HPI John Barrett is a 72 y.o. male.   HPI pleasant 72 year old male presents with tick bite that occurred on Tuesday of this past week.  Patient reports tick was removed in pieces but believes he removed all parts of tick.  PMH significant for SCC, A-fib/flutter, and ascending aorta dilation.  Patient is currently taking apixaban daily and denies any unusual bleeding.  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    Hemorrhoids    a.) s/p banding   History of adenomatous polyp of colon    q5 years   History of skin cancer    basal cell in past. Dr. Jorja Loa  arm, chest   Inguinal hernia recurrent bilateral    Long term current use of anticoagulant    a.) apixaban   Osteoarthritis    Psoriasis    has had to use orals in the past. Mainly controlled with triamcinolone per Dr. Jorja Loa   RLS (restless legs syndrome)    Rosacea    hydrocortisone OTC per Dr. Jorja Loa   Shingles    x2   Soft tissue mass    left lower leg. removed by Dr. 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/10/2020   SCC MID  BACK CX3 5FU    Patient Active Problem List   Diagnosis Date Noted   Persistent atrial fibrillation (HCC) 10/30/2022   Chronic left shoulder pain 11/25/2020   Arthritis of left knee 12/18/2017   Psoriasis    BPH associated with nocturia 06/04/2017   Allergy    History of skin cancer    Rosacea    Grover's disease    History of adenomatous polyp of colon    Left knee pain 03/02/2013   RESTLESS LEGS SYNDROME 07/27/2009    Past Surgical History:  Procedure Laterality Date   APPENDECTOMY     ATRIAL FIBRILLATION ABLATION N/A 11/20/2022   Procedure: ATRIAL FIBRILLATION ABLATION;  Surgeon: Regan Lemming, MD;  Location: [facility];  Service: Cardiovascular;  Laterality: N/A;   COLONOSCOPY  12/2014   Russella Dar - TA polyps    HEMORRHOID BANDING  2008   in office   HOLEP-LASER ENUCLEATION OF THE PROSTATE WITH MORCELLATION N/A 03/14/2023   Procedure: HOLEP-LASER ENUCLEATION OF THE PROSTATE WITH MORCELLATION;  Surgeon: Sondra Come, MD;  Location: [facility];  Service: Urology;  Laterality: N/A;   MASS EXCISION Left 07/29/2014   Procedure: EXCISION SOFT TISSUE MASS LEFT LOWER LEG;  Surgeon: Tawanna Cooler  Gerkin, MD;  Location: [facility];  Service: General;  Laterality: Left;   POLYPECTOMY     TONSILLECTOMY AND ADENOIDECTOMY     VEIN LIGATION     of penis. cialis in past for ED related to vein ligation/leak and BPH x2       Home Medications    Prior to Admission medications   Medication Sig Start Date End Date Taking? Authorizing Provider  Clobetasol Prop Emollient Base 0.05 % emollient cream Apply topically 1- 2 (two) times a day if needed (not for face or skin folds) Patient taking differently: 1 Application as needed. Apply topically 1- 2 (two) times a day if needed (not for face or skin folds) 02/04/22   Janalyn Harder, MD  doxycycline (VIBRAMYCIN) 100 MG capsule Take 1 capsule (100 mg total) by mouth 2 (two) times daily for 10 days. 04/26/23 05/06/23 Yes Trevor Iha, FNP  ELIQUIS 5 MG TABS tablet TAKE 1 TABLET(5 MG) BY MOUTH TWICE DAILY 03/25/23   Camnitz, Andree Coss, MD  Multiple Vitamin (MULTIVITAMIN) capsule Take 1 capsule by mouth daily. Men    [provider]  Omega-3 Fatty Acids (FISH OIL) 500 MG CAPS Take 1 capsule by mouth every evening.    [provider]  Polyethyl Glycol-Propyl Glycol (SYSTANE ULTRA) 0.4-0.3 % SOLN Place 1 drop into both eyes 3 (three) times daily as needed (dry eyes).    [provider]    Family History Family History  Problem Relation Age of Onset   Other Mother        fall related at age 43 years old.  2010   Cancer Father    Colon polyps Father    Atrial fibrillation Father        coumadin. 91 and doing well   Heart murmur Father        unsure which valve but has valve issue   Other Maternal Grandmother        early 29s- "old age"   Other Maternal Grandfather        unclear cause   Lung cancer Paternal Grandmother        smoker   Other Paternal Grandfather        unclear cause   Sudden death Neg Hx    Hypertension Neg Hx    Hyperlipidemia Neg Hx    Heart attack Neg Hx    Diabetes Neg Hx    Colon cancer Neg Hx    Rectal cancer Neg Hx    Stomach cancer Neg Hx     Social History Social History   Tobacco Use   Smoking status: Never   Smokeless tobacco: Never   Tobacco comments:    Never smoke 10/30/2022  Vaping Use   Vaping Use: Never used  Substance Use Topics   Alcohol use: Not Currently    Alcohol/week: 1.0 - 3.0 standard drink of alcohol    Types: 1 - 3 Glasses of wine per week    Comment: stop drinking 05/2022   Drug use: No     Allergies   Patient has no known allergies.   Review of Systems Review of Systems  Skin:  Positive for rash.  All other systems reviewed and are negative.    Physical Exam Triage Vital Signs ED Triage Vitals  Enc Vitals Group     BP      Pulse      Resp      Temp      Temp  src      SpO2      Weight      Height       Head Circumference      Peak Flow      Pain Score      Pain Loc      Pain Edu?      Excl. in GC?    No data found.  Updated Vital Signs BP 125/81 (BP Location: Left Arm)   Pulse 67   Temp 98.7 F (37.1 C) (Oral)   Resp 16   Ht 6\' 1"  (1.854 m)   Wt 157 lb (71.2 kg)   SpO2 96%   BMI 20.71 kg/m       Physical Exam Vitals and nursing note reviewed.  Constitutional:      Appearance: Normal appearance. He is normal weight.  HENT:     Head: Normocephalic and atraumatic.     Mouth/Throat:     Mouth: Mucous membranes are moist.     Pharynx: Oropharynx is clear.  Eyes:     Extraocular Movements: Extraocular movements intact.     Conjunctiva/sclera: Conjunctivae normal.     Pupils: Pupils are equal, round, and reactive to light.  Cardiovascular:     Rate and Rhythm: Normal rate and regular rhythm.     Pulses: Normal pulses.     Heart sounds: Normal heart sounds.  Pulmonary:     Effort: Pulmonary effort is normal.     Breath sounds: Normal breath sounds. No wheezing, rhonchi or rales.  Musculoskeletal:        General: Normal range of motion.     Cervical back: Normal range of motion and neck supple.  Skin:    General: Skin is warm and dry.     Comments: Neck (right-sided inferior aspect): Tiny (less than 0.5 cm) circular shaped erythematous area-please see image below   Neurological:     General: No focal deficit present.     Mental Status: He is alert and oriented to person, place, and time. Mental status is at baseline.  Psychiatric:        Mood and Affect: Mood normal.        Behavior: Behavior normal.        Thought Content: Thought content normal.      UC Treatments / Results  Labs (all labs ordered are listed, but only abnormal results are displayed) Labs Reviewed - No data to display  EKG   Radiology No results found.  Procedures Procedures (including critical care time)  Medications Ordered in UC Medications - No data to display  Initial  Impression / Assessment and Plan / UC Course  I have reviewed the triage vital signs and the nursing notes.  Pertinent labs & imaging results that were available during my care of the patient were reviewed by me and considered in my medical decision making (see chart for details).     MDM: 1.  Cellulitis of neck-Rx Doxycycline 100 mg capsule twice daily x 10 days; 2.  Tick bite of neck, initial encounter Rx'd Doxycycline 100 mg capsule twice daily x 10 days. Instructed patient to take medication as directed with food to completion.  Encouraged increase daily water intake to 64 ounces per day while taking this medication.  Advised if symptoms worsen and/or unresolved please follow-up with PCP or here for further evaluation.  Patient discharged home, hemodynamically stable.  Final Clinical Impressions(s) / UC Diagnoses   Final diagnoses:  Tick bite of neck,  initial encounter  Cellulitis of neck     Discharge Instructions      Instructed patient to take medication as directed with food to completion.  Encouraged increase daily water intake to 64 ounces per day while taking this medication.  Advised if symptoms worsen and/or unresolved please follow-up with PCP or here for further evaluation.     ED Prescriptions     Medication Sig Dispense Auth. Provider   doxycycline (VIBRAMYCIN) 100 MG capsule Take 1 capsule (100 mg total) by mouth 2 (two) times daily for 10 days. 20 capsule Trevor Iha, FNP      PDMP not reviewed this encounter.   Trevor Iha, FNP 04/26/23 1506

## 2023-04-26 NOTE — Discharge Instructions (Addendum)
Instructed patient to take medication as directed with food to completion.  Encouraged increase daily water intake to 64 ounces per day while taking this medication.  Advised if symptoms worsen and/or unresolved please follow-up with PCP or here for further evaluation. 

## 2023-05-29 ENCOUNTER — Encounter: Payer: Self-pay | Admitting: Urology

## 2023-05-29 ENCOUNTER — Ambulatory Visit (INDEPENDENT_AMBULATORY_CARE_PROVIDER_SITE_OTHER): Payer: Medicare Other | Admitting: Urology

## 2023-05-29 VITALS — BP 126/79 | HR 67 | Ht 73.0 in | Wt 166.0 lb

## 2023-05-29 DIAGNOSIS — N401 Enlarged prostate with lower urinary tract symptoms: Secondary | ICD-10-CM | POA: Diagnosis not present

## 2023-05-29 DIAGNOSIS — N138 Other obstructive and reflux uropathy: Secondary | ICD-10-CM

## 2023-05-29 DIAGNOSIS — Z125 Encounter for screening for malignant neoplasm of prostate: Secondary | ICD-10-CM

## 2023-05-29 NOTE — Progress Notes (Signed)
   05/29/2023 4:06 PM   John Barrett Anselmo Rod 1951-02-21 [id number]  Reason for visit: Follow up BPH status post HOLEP  HPI: 72 year old male with long history of obstructive urinary symptoms, nocturia 5-6 times per night, history of retention and CIC who underwent a uncomplicated HOLEP on 03/14/2023 with removal of 101g of benign prostate tissue.  He has been doing well since that time.  PVR today normal at 13ml.  He reports minimal leakage, and is at the point where he does not think he needs a pad for safety anymore.  He occasionally has some urgency.  Has some persistent nocturia 1-2 times per night over a 9-hour sleep period.  Drinking some Gatorade zero during the day, and we discussed avoiding artificial sweeteners.  We reviewed expected postoperative healing after HOLEP, could consider an OAB medication in the future, but anticipate his symptoms will continue to improve over the next few months.  Continues on Eliquis, has not had problems with recurrent gross hematuria.  RTC 4 months PVR, PSA prior  Sondra Come, MD  [facility] [address] [place], [place] [address] [phone]

## 2023-06-11 ENCOUNTER — Ambulatory Visit: Payer: Medicare Other | Admitting: Urology

## 2023-06-12 ENCOUNTER — Ambulatory Visit (INDEPENDENT_AMBULATORY_CARE_PROVIDER_SITE_OTHER): Payer: Medicare Other | Admitting: Gastroenterology

## 2023-06-12 ENCOUNTER — Telehealth: Payer: Self-pay

## 2023-06-12 ENCOUNTER — Encounter: Payer: Self-pay | Admitting: Gastroenterology

## 2023-06-12 VITALS — BP 126/76 | HR 70 | Ht 73.0 in | Wt 151.1 lb

## 2023-06-12 DIAGNOSIS — Z86010 Personal history of colonic polyps: Secondary | ICD-10-CM

## 2023-06-12 MED ORDER — NA SULFATE-K SULFATE-MG SULF 17.5-3.13-1.6 GM/177ML PO SOLN: ORAL | 0 refills | Status: DC

## 2023-06-12 NOTE — Patient Instructions (Addendum)
You have been scheduled for a colonoscopy. Please follow written instructions given to you at your visit today.   Please pick up your prep supplies at the pharmacy within the next 1-3 days.  If you use inhalers (even only as needed), please bring them with you on the day of your procedure.  DO NOT TAKE 7 DAYS PRIOR TO TEST- Trulicity (dulaglutide) Ozempic, Wegovy (semaglutide) Mounjaro (tirzepatide) Bydureon Bcise (exanatide extended release)  DO NOT TAKE 1 DAY PRIOR TO YOUR TEST Rybelsus (semaglutide) Adlyxin (lixisenatide) Victoza (liraglutide) Byetta (exanatide) ___________________________________________________________________________   We have sent the following medications to your pharmacy for you to pick up at your convenience: Suprep  You will be contacted by our office prior to your procedure for directions on holding your Eliquis.  If you do not hear from our office 1 week prior to your scheduled procedure, please call [phone] to discuss.  _______________________________________________________  If your blood pressure at your visit was 140/90 or greater, please contact your primary care physician to follow up on this.  _______________________________________________________  If you are age 72 or older, your body mass index should be between 23-30. Your Body mass index is 19.94 kg/m. If this is out of the aforementioned range listed, please consider follow up with your Primary Care Provider.  If you are age 72 or younger, your body mass index should be between 19-25. Your Body mass index is 19.94 kg/m. If this is out of the aformentioned range listed, please consider follow up with your Primary Care Provider.   ________________________________________________________  The [facility] providers would like to encourage you to use [facility] to communicate with providers for non-urgent requests or questions.  Due to long hold times on the telephone, sending your provider  a message by [facility] may be a faster and more efficient way to get a response.  Please allow 48 business hours for a response.  Please remember that this is for non-urgent requests.  _______________________________________________________   Due to recent changes in healthcare laws, you may see the results of your imaging and laboratory studies on MyChart before your provider has had a chance to review them.  We understand that in some cases there may be results that are confusing or concerning to you. Not all laboratory results come back in the same time frame and the provider may be waiting for multiple results in order to interpret others.  Please give [place] 48 hours in order for your provider to thoroughly review all the results before contacting the office for clarification of your results.   Thank you for entrusting me with your care and choosing [facility].  [employer]

## 2023-06-12 NOTE — Progress Notes (Signed)
Chief Complaint: Repeat colonoscopy Primary GI MD: Dr. Russella Dar  HPI: 72 year old male history of A-fib (on Eliquis), BPH with recent enuculation of the prostate, presents for evaluation of repeat colonoscopy.  Patient has a history of A-fib and underwent ablation 11/20/2022 with Dr. Elberta Fortis.  He remains on Eliquis and has appointment in October whether or not to proceed with long-term anticoagulation.  Recently he underwent prostate procedure 03/14/2023 in which he held his Eliquis for 3 days without issues.  He is here today to discuss a repeat colonoscopy for history of colon polyps.  Denies GI issues.  Denies constipation, melena, hematochezia, nausea, vomiting.     PREVIOUS GI WORKUP   Colonoscopy 02/2020 for history of colon polyps - 3 sessile polyps in transverse and ascending colon (7 to 8 mm in size).  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    Hemorrhoids    a.) s/p banding   History of adenomatous polyp of colon    q5 years   History of skin cancer    basal cell in past. Dr. Jorja Loa  arm, chest   Inguinal hernia recurrent bilateral    Long term current use of anticoagulant    a.) apixaban    Osteoarthritis    Psoriasis    has had to use orals in the past. Mainly controlled with triamcinolone per Dr. Jorja Loa   RLS (restless legs syndrome)    Rosacea    hydrocortisone OTC per Dr. Jorja Loa   Shingles    x2   Soft tissue mass    left lower leg. removed by Dr. 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/10/2020   SCC MID BACK CX3 5FU    Past Surgical History:  Procedure Laterality Date   APPENDECTOMY     ATRIAL FIBRILLATION ABLATION N/A 11/20/2022   Procedure: ATRIAL FIBRILLATION ABLATION;  Surgeon: Regan Lemming, MD;  [facility];  Service: Cardiovascular;  Laterality: N/A;   COLONOSCOPY  12/2014   Russella Dar - TA polyps    HEMORRHOID BANDING  2008   in office   HOLEP-LASER ENUCLEATION OF THE PROSTATE WITH MORCELLATION N/A 03/14/2023   Procedure: HOLEP-LASER ENUCLEATION OF THE PROSTATE WITH MORCELLATION;  Surgeon: Sondra Come, MD;  Location: [facility];  Service: Urology;  Laterality: N/A;   MASS EXCISION Left 07/29/2014   Procedure: EXCISION SOFT TISSUE MASS LEFT LOWER LEG;  Surgeon: Darnell Level, MD;  Location: [facility];  Service: General;  Laterality: Left;   POLYPECTOMY     TONSILLECTOMY AND ADENOIDECTOMY  VEIN LIGATION     of penis. cialis in past for ED related to vein ligation/leak and BPH x2    Current Outpatient Medications  Medication Sig Dispense Refill   Clobetasol Prop Emollient Base 0.05 % emollient cream Apply topically 1- 2 (two) times a day if needed (not for face or skin folds) (Patient taking differently: 1 Application as needed. Apply topically 1- 2 (two) times a day if needed (not for face or skin folds)) 60 g 6   ELIQUIS 5 MG TABS tablet TAKE 1 TABLET(5 MG) BY MOUTH TWICE DAILY 60 tablet 6   Multiple Vitamin (MULTIVITAMIN) capsule Take 1 capsule by mouth daily. Men     Omega-3 Fatty Acids (FISH OIL) 500 MG CAPS Take 1 capsule by mouth every  evening.     Polyethyl Glycol-Propyl Glycol (SYSTANE ULTRA) 0.4-0.3 % SOLN Place 1 drop into both eyes 3 (three) times daily as needed (dry eyes).     No current facility-administered medications for this visit.    Allergies as of 06/12/2023   (No Known Allergies)    Family History  Problem Relation Age of Onset   Other Mother        fall related at age 20 years old.  2010   Cancer Father    Colon polyps Father    Atrial fibrillation Father        coumadin. 91 and doing well   Heart murmur Father        unsure which valve but has valve issue   Other Maternal Grandmother        early 19s- "old age"   Other Maternal Grandfather        unclear cause   Lung cancer Paternal Grandmother        smoker   Other Paternal Grandfather        unclear cause   Sudden death Neg Hx    Hypertension Neg Hx    Hyperlipidemia Neg Hx    Heart attack Neg Hx    Diabetes Neg Hx    Colon cancer Neg Hx    Rectal cancer Neg Hx    Stomach cancer Neg Hx     Social History   Socioeconomic History   Marital status: Married    Spouse name: Not on file   Number of children: Not on file   Years of education: Not on file   Highest education level: Not on file  Occupational History   Not on file  Tobacco Use   Smoking status: Never   Smokeless tobacco: Never   Tobacco comments:    Never smoke 10/30/2022  Vaping Use   Vaping status: Never Used  Substance and Sexual Activity   Alcohol use: Not Currently    Alcohol/week: 1.0 - 3.0 standard drink of alcohol    Types: 1 - 3 Glasses of wine per week    Comment: stop drinking 05/2022   Drug use: No   Sexual activity: Not on file  Other Topics Concern   Not on file  Social History Narrative   Married 12 years in 2018. 2 grown children and 2 grandkids (59 and 64 year old sons in 2018- live in [place]- moving to [place] soon). One son lives [place].    Mother in law moved in with them- Parkinsons 16 years old- Dr. 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 Masters- at [employer].    Retired from  [employer] previously      Hobbies: workout- yoga classes at [facility]- stationary bike. Light weight workout. Fair amount of travel- enjoys this.    Social Determinants of Health   Financial Resource Strain: Low Risk  (06/18/2022)   Received from [facility]   Overall Financial Resource Strain (CARDIA)    Difficulty of Paying Living Expenses: Not hard at all  Food Insecurity: Not on file  Transportation Needs: Not on file  Physical Activity: Not on file  Stress: No Stress Concern Present (06/18/2022)   Received from [facility] - Occupational Stress Questionnaire    Feeling of Stress : Only a little  Social Connections: Unknown (06/18/2022)   Received from [facility]   Social Network    Social Network: Not on file  Intimate Partner Violence: Unknown (06/18/2022)   Received from [facility]   HITS    Physically Hurt: Not on file    Insult or Talk Down To: Not on file    Threaten Physical Harm: Not on file    Scream or Curse: Not on file    Review of Systems:    Constitutional: No weight loss, fever, chills, weakness or fatigue HEENT: Eyes: No change in vision               Ears, Nose, Throat:  No change in hearing or congestion Skin: No rash or itching Cardiovascular: No chest pain, chest pressure or palpitations   Respiratory: No SOB or cough Gastrointestinal: See HPI and otherwise negative Genitourinary: No dysuria or change in urinary frequency Neurological: No headache, dizziness or syncope Musculoskeletal: No new muscle or joint pain Hematologic: No bleeding or bruising Psychiatric: No history of depression or anxiety    Physical Exam:  Vital signs: There were no vitals taken for this visit.  Constitutional: NAD, Well developed, Well nourished, alert and cooperative Head:  Normocephalic and atraumatic. Eyes:    PEERL, EOMI. No icterus. Conjunctiva pink. Respiratory: Respirations even and unlabored. Lungs clear to auscultation bilaterally.   No wheezes, crackles, or rhonchi.  Cardiovascular:  Regular rate and rhythm. No peripheral edema, cyanosis or pallor.  Gastrointestinal:  Soft, nondistended, nontender. No rebound or guarding. Normal bowel sounds. No appreciable masses or hepatomegaly. Rectal:  Not performed.  Msk:  Symmetrical without gross deformities. Without edema, no deformity or joint abnormality.  Neurologic:  Alert and  oriented x4;  grossly normal neurologically.  Skin:   Dry and intact without significant lesions or rashes. Psychiatric: Oriented to person, place and time. Demonstrates good judgement and reason without abnormal affect or behaviors.   RELEVANT LABS AND IMAGING: CBC    Component Value Date/Time   WBC 4.8 03/14/2023 0652   RBC 4.92 03/14/2023 0652   HGB 14.6 03/14/2023 0652   HCT 44.8 03/14/2023 0652   PLT 170 03/14/2023 0652   MCV 91.1 03/14/2023 0652   MCH 29.7 03/14/2023 0652   MCHC 32.6 03/14/2023 0652   RDW 13.8 03/14/2023 0652   LYMPHSABS 0.6 (L) 09/04/2022 1229   MONOABS 0.5 09/04/2022 1229   EOSABS 0.0 09/04/2022 1229   BASOSABS 0.0 09/04/2022 1229    CMP     Component Value Date/Time   NA 140 03/14/2023 0652   K 3.8 03/14/2023 0652   CL 107 03/14/2023 0652   CO2 27 03/14/2023 0652   GLUCOSE 100 (H) 03/14/2023 0652   BUN 21 03/14/2023 0652   CREATININE 0.82 03/14/2023 0652   CALCIUM 8.5 (  L) 03/14/2023 0652   PROT 5.8 (L) 07/09/2017 1600   ALBUMIN 3.7 07/09/2017 1600   AST 23 07/09/2017 1600   ALT 21 07/09/2017 1600   ALKPHOS 63 07/09/2017 1600   BILITOT 1.3 (H) 07/09/2017 1600   GFRNONAA >60 03/14/2023 2956     Assessment/Plan:   History of colon polyps - Schedule colonoscopy - I thoroughly discussed the procedure with the patient (at bedside) to include nature of the procedure, alternatives, benefits, and risks (including but not  limited to bleeding, infection, perforation, anesthesia/cardiac pulmonary complications).  Patient verbalized understanding and gave verbal consent to proceed with procedure.  -Hold Eliquis for 2 days.  Will obtain blood thinner cease from Dr. Elberta Fortis - Follow-up per procedure  Boone Master, PA-C  Gastroenterology 06/12/2023, 1:11 PM  Cc: Shelva Majestic, MD

## 2023-06-12 NOTE — Telephone Encounter (Signed)
[facility] Pre-operative Risk Assessment     Request for surgical clearance:     Endoscopy Procedure  What type of surgery is being performed?     Colonoscopy  When is this surgery scheduled?     07/07/23  What type of clearance is required ?   Pharmacy  Are there any medications that need to be held prior to surgery and how long? Eliquis 2 day's hold  Practice name and name of physician performing surgery?      [facility]  What is your office phone and fax number?      Phone- [phone]  Fax- [phone]  Anesthesia type (None, local, MAC, general) ?       MAC

## 2023-06-13 ENCOUNTER — Telehealth: Payer: Self-pay | Admitting: *Deleted

## 2023-06-13 ENCOUNTER — Telehealth: Payer: Self-pay

## 2023-06-13 NOTE — Telephone Encounter (Signed)
Pt has been scheduled for tele pre op appt 06/24/23@ 2 pm. Med rec and consent are done.

## 2023-06-13 NOTE — Telephone Encounter (Signed)
Patient with diagnosis of atrial fibrillation on Eliquis for anticoagulation.    What type of surgery is being performed?     Colonoscopy  When is this surgery scheduled?     07/07/23    CHA2DS2-VASc Score = 1   This indicates a 0.6% annual risk of stroke. The patient's score is based upon: CHF History: 0 HTN History: 0 Diabetes History: 0 Stroke History: 0 Vascular Disease History: 0 Age Score: 1 Gender Score: 0       CrCl 79 Platelet count 181  Per office protocol, patient can hold Eliquis for 2 days prior to procedure.   Patient will not need bridging with Lovenox (enoxaparin) around procedure.  **This guidance is not considered finalized until pre-operative APP has relayed final recommendations.**

## 2023-06-13 NOTE — Telephone Encounter (Signed)
Received fax from cardiology okay to hold Eliquis 2 day's prior to procedure please keep pre op appointment 06/24/23 at 2 pm. Patient is aware and verbalized understanding

## 2023-06-13 NOTE — Telephone Encounter (Signed)
   Name: John Barrett  DOB: [birth year]  MRN: [id number]  Primary Cardiologist: None   Preoperative team, please contact this patient and set up a phone call appointment for further preoperative risk assessment. Please obtain consent and complete medication review. Thank you for your help.  I confirm that guidance regarding antiplatelet and oral anticoagulation therapy has been completed and, if necessary, noted below.  Per office protocol, patient can hold Eliquis for 2 days prior to procedure.   Patient will not need bridging with Lovenox (enoxaparin) around procedure.   Ronney Asters, NP 06/13/2023, 8:30 AM [facility]

## 2023-06-24 ENCOUNTER — Ambulatory Visit: Payer: Medicare Other | Attending: Cardiovascular Disease | Admitting: Student

## 2023-06-24 DIAGNOSIS — Z01810 Encounter for preprocedural cardiovascular examination: Secondary | ICD-10-CM | POA: Diagnosis not present

## 2023-06-24 NOTE — Progress Notes (Signed)
Virtual Visit via Telephone Note   Because of John Barrett's co-morbid illnesses, he is at least at moderate risk for complications without adequate follow up.  This format is felt to be most appropriate for this patient at this time.  The patient did not have access to video technology/had technical difficulties with video requiring transitioning to audio format only (telephone).  All issues noted in this document were discussed and addressed.  No physical exam could be performed with this format.  Please refer to the patient's chart for his consent to telehealth for [facility].  Evaluation Performed:  Preoperative cardiovascular risk assessment _____________   Date:  06/24/2023   Patient ID:  John Barrett, DOB 08/23/1951, MRN [id number] Patient Location:  Home Provider location:   Office  Primary Care Provider:  Shelva Majestic, MD Primary Cardiologist:  Dr. Elberta Fortis  Chief Complaint / Patient Profile   72 y.o. y/o male with a h/o PAF on anticoagulation who is pending colonoscopy by 07/07/2023 and presents today for telephonic preoperative cardiovascular risk assessment.  History of Present Illness    John Barrett is a 72 y.o. male who presents via audio/video conferencing for a telehealth visit today.  Pt was last seen in cardiology clinic on 02/17/2023 by Dr. Elberta Fortis.  At that time John Barrett was stable from a cardiac standpoint.  The patient is now pending procedure as outlined above. Since his last visit, he is doing well. Patient denies shortness of breath or dyspnea on exertion. No chest pain, pressure, or tightness. Denies lower extremity edema, orthopnea, or PND. No palpitations.  He is very active getting 13-14,000 steps a day. [facility] and with free weights and walks for about 30 minutes.   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    Hemorrhoids    a.) s/p banding   History of adenomatous polyp of colon    q5 years   History of skin cancer    basal cell in past. Dr. Jorja Loa  arm, chest   Inguinal hernia recurrent bilateral    Long term current use of anticoagulant    a.) apixaban   Osteoarthritis    Psoriasis    has had to use orals in the past. Mainly controlled with triamcinolone per Dr. Jorja Loa   RLS (restless legs syndrome)    Rosacea    hydrocortisone OTC per Dr. Jorja Loa   Shingles    x2   Soft tissue mass    left lower leg. removed by Dr. 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/10/2020  SCC MID BACK CX3 5FU   Past Surgical History:  Procedure Laterality Date   APPENDECTOMY     ATRIAL FIBRILLATION ABLATION N/A 11/20/2022   Procedure: ATRIAL FIBRILLATION ABLATION;  Surgeon: Regan Lemming, MD;  [facility]: [facility];  Service: Cardiovascular;  Laterality: N/A;   COLONOSCOPY  12/2014   John Barrett - TA polyps    HEMORRHOID BANDING  2008   in office   HOLEP-LASER ENUCLEATION OF THE PROSTATE WITH MORCELLATION N/A 03/14/2023   Procedure: HOLEP-LASER ENUCLEATION OF THE PROSTATE WITH MORCELLATION;  Surgeon: Sondra Come, MD;  Location: [facility];  Service: Urology;  Laterality: N/A;   MASS  EXCISION Left 07/29/2014   Procedure: EXCISION SOFT TISSUE MASS LEFT LOWER LEG;  Surgeon: Darnell Level, MD;  Location: [facility];  Service: General;  Laterality: Left;   POLYPECTOMY     TONSILLECTOMY AND ADENOIDECTOMY     VEIN LIGATION     of penis. 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 Authorizing Provider  Clobetasol Prop Emollient Base 0.05 % emollient cream Apply topically 1- 2 (two) times a day if needed (not for face or skin folds) Patient taking differently: 1 Application as needed. Apply topically 1- 2 (two) times a day if needed (not for face or skin folds) 02/04/22   Janalyn Harder, MD  ELIQUIS 5 MG TABS tablet TAKE 1 TABLET(5 MG) BY MOUTH TWICE DAILY 03/25/23   Camnitz, Andree Coss, MD  Multiple Vitamin (MULTIVITAMIN) capsule Take 1 capsule by mouth daily. Men    [provider]  Na Sulfate-K Sulfate-Mg Sulf 17.5-3.13-1.6 GM/177ML SOLN Use as directed; may use generic; goodrx card if insurance will not cover generic 06/12/23   Legrand Como, PA-C  Omega-3 Fatty Acids (FISH OIL) 500 MG CAPS Take 1 capsule by mouth every evening.    [provider]  Polyethyl Glycol-Propyl Glycol (SYSTANE ULTRA) 0.4-0.3 % SOLN Place 1 drop into both eyes 3 (three) times daily as needed (dry eyes).    [provider]    Physical Exam    Vital Signs:  John Barrett does not have vital signs available for review today.  Given telephonic nature of communication, physical exam is limited. AAOx3. NAD. Normal affect.  Speech and respirations are unlabored.  Accessory Clinical Findings    None  Assessment & Plan    Primary Cardiologist: Dr. Elberta Fortis  Preoperative cardiovascular risk assessment. Colonoscopy by [facility] on 07/07/2023.  Chart reviewed as part of pre-operative protocol  coverage. According to the RCRI, patient has a 0.4% risk of MACE. Patient reports activity equivalent to >4.0 METS (walking 30 minutes[facility], gets 13-14,000 steps a day).   Given past medical history and time since last visit, based on ACC/AHA guidelines, John Barrett would be at acceptable risk for the planned procedure without further cardiovascular testing.   Patient was advised that if he develops new symptoms prior to surgery to contact our office to arrange a follow-up appointment.  he verbalized understanding.  Per Pharm D, patient may hold Eliquis 2 days prior to procedure. He will not need bridging with Lovenox around procedure.   I will route this recommendation to the requesting party via Epic fax function.  Please call with questions.  Time:   Today, I have  spent 5 minutes with the patient with telehealth technology discussing medical history, symptoms, and management plan.     Carlos Levering, NP  06/24/2023, 8:07 AM

## 2023-07-07 ENCOUNTER — Ambulatory Visit (AMBULATORY_SURGERY_CENTER): Payer: Medicare Other | Admitting: Gastroenterology

## 2023-07-07 ENCOUNTER — Encounter: Payer: Self-pay | Admitting: Gastroenterology

## 2023-07-07 VITALS — BP 120/71 | HR 62 | Temp 97.1°F | Resp 15 | Ht 73.0 in | Wt 155.0 lb

## 2023-07-07 DIAGNOSIS — D120 Benign neoplasm of cecum: Secondary | ICD-10-CM | POA: Diagnosis not present

## 2023-07-07 DIAGNOSIS — Z09 Encounter for follow-up examination after completed treatment for conditions other than malignant neoplasm: Secondary | ICD-10-CM

## 2023-07-07 DIAGNOSIS — Z86010 Personal history of colonic polyps: Secondary | ICD-10-CM | POA: Diagnosis not present

## 2023-07-07 MED ORDER — SODIUM CHLORIDE 0.9 % IV SOLN
500.0000 mL | Freq: Once | INTRAVENOUS | Status: DC
Start: 2023-07-07 — End: 2023-11-03

## 2023-07-07 NOTE — Progress Notes (Signed)
Called to room to assist during endoscopic procedure.  Patient ID and intended procedure confirmed with present staff. Received instructions for my participation in the procedure from the performing physician.  

## 2023-07-07 NOTE — Progress Notes (Signed)
Uneventful anesthetic. Report to pacu rn. Vss. Care resumed by rn. 

## 2023-07-07 NOTE — Patient Instructions (Signed)
YOU HAD AN ENDOSCOPIC PROCEDURE TODAY AT THE [facility]:   Refer to the procedure report that was given to you for any specific questions about what was found during the examination.  If the procedure report does not answer your questions, please call your gastroenterologist to clarify.  If you requested that your care partner not be given the details of your procedure findings, then the procedure report has been included in a sealed envelope for you to review at your convenience later.  YOU SHOULD EXPECT: Some feelings of bloating in the abdomen. Passage of more gas than usual.  Walking can help get rid of the air that was put into your GI tract during the procedure and reduce the bloating. If you had a lower endoscopy (such as a colonoscopy or flexible sigmoidoscopy) you may notice spotting of blood in your stool or on the toilet paper. If you underwent a bowel prep for your procedure, you may not have a normal bowel movement for a few days.  Please Note:  You might notice some irritation and congestion in your nose or some drainage.  This is from the oxygen used during your procedure.  There is no need for concern and it should clear up in a day or so.  SYMPTOMS TO REPORT IMMEDIATELY:  Following lower endoscopy (colonoscopy or flexible sigmoidoscopy):  Excessive amounts of blood in the stool  Significant tenderness or worsening of abdominal pains  Swelling of the abdomen that is new, acute  Fever of 100F or higher   For urgent or emergent issues, a gastroenterologist can be reached at any hour by calling [phone]. Do not use MyChart messaging for urgent concerns.    DIET:  We do recommend a small meal at first, but then you may proceed to your regular diet.  Drink plenty of fluids but you should avoid alcoholic beverages for 24 hours.  MEDICATIONS: Resume Eliquis (apixaban) TOMORROW at prior dose. Refer to managing physician for further adjustment of therapy.  FOLLOW  UP: Await pathology results.  Please see handouts given to you by your recovery nurse: Polyps, Diverticulosis, Hemorrhoids.  Thank you for allowing [place] to provide for your healthcare needs today.  ACTIVITY:  You should plan to take it easy for the rest of today and you should NOT DRIVE or use heavy machinery until tomorrow (because of the sedation medicines used during the test).    FOLLOW UP: Our staff will call the number listed on your records the next business day following your procedure.  We will call around 7:15- 8:00 am to check on you and address any questions or concerns that you may have regarding the information given to you following your procedure. If we do not reach you, we will leave a message.     If any biopsies were taken you will be contacted by phone or by letter within the next 1-3 weeks.  Please call us at [phone] if you have not heard about the biopsies in 3 weeks.    SIGNATURES/CONFIDENTIALITY: You and/or your care partner have signed paperwork which will be entered into your electronic medical record.  These signatures attest to the fact that that the information above on your After Visit Summary has been reviewed and is understood.  Full responsibility of the confidentiality of this discharge information lies with you and/or your care-partner.

## 2023-07-07 NOTE — Op Note (Signed)
[facility] Patient Name: John Barrett Procedure Date: 07/07/2023 8:59 AM MRN: [id number] Endoscopist: Meryl Dare , MD, [phone] Age: 72 Referring MD:  Date of Birth: Jun 13, 1951 Gender: Male Account #: [id number] Procedure:                Colonoscopy Indications:              Surveillance: Personal history of adenomatous and                            sessile serrated polyps on last colonoscopy 3 years                            ago Medicines:                Monitored Anesthesia Care Procedure:                Pre-Anesthesia Assessment:                           - Prior to the procedure, a History and Physical                            was performed, and patient medications and                            allergies were reviewed. The patient's tolerance of                            previous anesthesia was also reviewed. The risks                            and benefits of the procedure and the sedation                            options and risks were discussed with the patient.                            All questions were answered, and informed consent                            was obtained. Prior Anticoagulants: The patient has                            taken Eliquis (apixaban), last dose was 2 days                            prior to procedure. ASA Grade Assessment: II - A                            patient with mild systemic disease. After reviewing                            the risks and benefits, the patient was deemed in  satisfactory condition to undergo the procedure.                           After obtaining informed consent, the colonoscope                            was passed under direct vision. Throughout the                            procedure, the patient's blood pressure, pulse, and                            oxygen saturations were monitored continuously. The                            Olympus Scope SN: [id number] was  introduced through                            the anus and advanced to the the cecum, identified                            by appendiceal orifice and ileocecal valve. The                            ileocecal valve, appendiceal orifice, and rectum                            were photographed. The quality of the bowel                            preparation was good. The colonoscopy was performed                            without difficulty. The patient tolerated the                            procedure well. Scope In: 9:04:23 AM Scope Out: 9:21:43 AM Scope Withdrawal Time: 0 hours 11 minutes 50 seconds  Total Procedure Duration: 0 hours 17 minutes 20 seconds  Findings:                 The perianal and digital rectal examinations were                            normal.                           A 6 mm polyp was found in the cecum. The polyp was                            sessile. The polyp was removed with a cold snare.                            Resection and retrieval were complete.  Scattered medium-mouthed and small-mouthed                            diverticula were found in the entire colon. There                            was no evidence of diverticular bleeding.                           A diffuse area of mild melanosis was found in the                            sigmoid colon and in the descending colon.                           Internal hemorrhoids were found during                            retroflexion. The hemorrhoids were small and Grade                            I (internal hemorrhoids that do not prolapse).                           The exam was otherwise without abnormality on                            direct and retroflexion views. Complications:            No immediate complications. Estimated blood loss:                            None. Estimated Blood Loss:     Estimated blood loss: none. Impression:               - One 6 mm polyp in the  cecum, removed with a cold                            snare. Resected and retrieved.                           - Mild diverticulosis in the entire examined colon.                           - Mild melanosis in the colon.                           - Internal hemorrhoids.                           - The examination was otherwise normal on direct                            and retroflexion views. Recommendation:           - Repeat colonoscopy vs no repeat due to age  date                            to be determined after pending pathology results                            are reviewed for surveillance based on pathology                            results.                           - Resume Eliquis (apixaban) tomorrow at prior dose.                            Refer to managing physician for further adjustment                            of therapy.                           - Patient has a contact number available for                            emergencies. The signs and symptoms of potential                            delayed complications were discussed with the                            patient. Return to normal activities tomorrow.                            Written discharge instructions were provided to the                            patient.                           - Resume previous diet.                           - Continue present medications.                           - Await pathology results. Meryl Dare, MD 07/07/2023 9:27:15 AM This report has been signed electronically.

## 2023-07-07 NOTE — Progress Notes (Signed)
See 06/12/2023 H&P, no changes

## 2023-07-08 ENCOUNTER — Telehealth: Payer: Self-pay

## 2023-07-08 NOTE — Telephone Encounter (Signed)
Follow up call placed, no answer and no VM. 

## 2023-07-10 ENCOUNTER — Other Ambulatory Visit: Payer: Self-pay

## 2023-07-10 ENCOUNTER — Encounter: Payer: Self-pay | Admitting: Family Medicine

## 2023-07-10 DIAGNOSIS — K4090 Unilateral inguinal hernia, without obstruction or gangrene, not specified as recurrent: Secondary | ICD-10-CM

## 2023-07-10 LAB — SURGICAL PATHOLOGY

## 2023-07-24 ENCOUNTER — Encounter: Payer: Self-pay | Admitting: Gastroenterology

## 2023-08-26 ENCOUNTER — Ambulatory Visit: Payer: Self-pay | Admitting: Surgery

## 2023-08-26 NOTE — H&P (Signed)
I met with the patient at the request of Dr. Gerrit Friends and agree he does have large bilateral inguinal hernias which would be most amenable to minimally invasive repair.  I went over the procedure with him and discussed relevant anatomy, surgical technique, use of mesh, and risks including bleeding, infection, pain, scarring, injury to intra-abdominal or retroperitoneal structures including bowel, bladder, nerves, blood vessels etc.; risk of hernia recurrence, chronic pain, hematoma or seroma, as well as general cardiovascular/pulmonary/thromboembolic complications.  We discussed typical postop recovery, timeline and activity limitations. Questions were come and answered to the patient's satisfaction. Patient wishes to proceed with scheduling.    Progress Notes Barrett, John Rummage, MD (Physician)  General Surgery Expand All Collapse All      REFERRING PHYSICIAN:  Shelva Majestic, MD   PROVIDER:  Bonnetta Barry, MD   MRN: [id number] DOB: 1951-08-02 DATE OF ENCOUNTER: 08/26/2023    Subjective   Chief Complaint: New Consultation ( Inguinal hernia)       History of Present Illness:   Patient is referred by Dr. Tana Conch for evaluation of inguinal hernia.  Patient states that the hernias have been present for approximately 1 year.  He has bilateral inguinal hernia, greater on the left than on the right.  He has been having some discomfort especially later in the day.  He is having more difficulty reducing the left-sided hernia than he had in the past.  He denies any signs or symptoms of obstruction.  He did have a recent colonoscopy.  His only previous abdominal surgery was an appendectomy as a child.  He has had transurethral prostate surgery.   Of note, the patient has a history of atrial fibrillation and is currently still on Eliquis.  However he underwent a successful ablation and remains in sinus rhythm.  He may be able to discontinue the Eliquis in the near future.     Review  of Systems: A complete review of systems was obtained from the patient.  I have reviewed this information and discussed as appropriate with the patient.  See HPI as well for other ROS.   Review of Systems  Constitutional: Negative.   HENT: Negative.    Eyes: Negative.   Respiratory: Negative.    Cardiovascular:  Positive for palpitations.  Gastrointestinal: Negative.   Genitourinary:        Bilateral inguinal herniae  Musculoskeletal: Negative.   Skin: Negative.   Neurological: Negative.   Endo/Heme/Allergies:  Bruises/bleeds easily.  Psychiatric/Behavioral: Negative.          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  BPH associated with nocturia  Elevated PSA  Erectile dysfunction  New onset atrial fibrillation (CMS/HHS-HCC)  Bilateral inguinal hernia       Past Surgical History Past Surgical History: Procedure Laterality Date  CARDIAC FOCAL ABLATION UTILIZING RADIATION THERAPY       growth on leg      LIGATION FEMORAL VEIN      prostate reduction           Allergies No Known Allergies     Medications Ordered Prior to Encounter Current Outpatient Medications on File Prior to Visit Medication Sig Dispense Refill  ELIQUIS 5 mg tablet Take 5 mg by mouth 2 (two) times daily      multivitamin  tablet Take 1 tablet by mouth once daily      omega 3-dha-epa-fish oil (FISH OIL) 1,000 (120-180) mg Cap        alfuzosin (UROXATRAL) 10 mg ER tablet Take 1 tablet by mouth every day 90 tablet 3  calcipotriene-betamethasone (ENSTILAR) 0.005-0.064 % Foam Apply to affected area(s) daily 60 g 5    No current facility-administered medications on file prior to visit.       Family History Family History Problem Relation Age of Onset  Skin cancer Mother         Tobacco Use  History Social History    Tobacco Use Smoking Status Never Smokeless Tobacco Never       Social History Social History    Socioeconomic History  Marital status: Married Tobacco Use  Smoking status: Never  Smokeless tobacco: Never Substance and Sexual Activity  Alcohol use: Not Currently  Drug use: Never    Social Drivers of Health    Financial Resource Strain: Low Risk  (06/18/2022)   Received from [facility]   Overall Financial Resource Strain (CARDIA)    Difficulty of Paying Living Expenses: Not hard at all Stress: No Stress Concern Present (06/18/2022)   Received from [facility] of Occupational Health - Occupational Stress Questionnaire    Feeling of Stress : Only a little   Received from [employer]   Social Network       Objective:      Vitals:   08/26/23 1404 BP: 127/81 Pulse: 66 Temp: 36.7 C (98 F) SpO2: 98% Weight: 68.5 kg (151 lb) Height: 185.4 cm (6\' 1" )   Body mass index is 19.92 kg/m.   Physical Exam    GENERAL APPEARANCE Comfortable, no acute issues Development: normal Gross deformities: none   SKIN Rash, lesions, ulcers: none Induration, erythema: none Nodules: none palpable   EYES Conjunctiva and lids: normal Pupils: equal and reactive   EARS, NOSE, MOUTH, THROAT External ears: no lesion or deformity External nose: no lesion or deformity Hearing: grossly normal   NECK Symmetric: yes Trachea: midline   ABDOMEN Sign of umbilical hernia.   GENITOURINARY/RECTAL Obvious bilateral inguinal hernia, likely direct.  Normal male genitalia without mass or lesion.  Palpation in the right inguinal canal shows a large bulge which is reducible with manipulation.  This augments with cough or Valsalva.  On the left the bulge is slightly larger.  With manipulation it is reducible but augments with cough and Valsalva.   MUSCULOSKELETAL Station and gait: normal Digits and nails: no clubbing or  cyanosis Muscle strength: grossly normal all extremities Range of motion: grossly normal all extremities Deformity: none   LYMPHATIC Cervical: none palpable Supraclavicular: none palpable   PSYCHIATRIC Oriented to person, place, and time: yes Mood and affect: normal for situation Judgment and insight: appropriate for situation        Assessment and Plan: Diagnoses and all orders for this visit:   Non-recurrent bilateral inguinal hernia without obstruction or gangrene     Patient is referred by his primary care physician for surgical evaluation and management of bilateral inguinal hernia.   Patient has had bilateral inguinal hernia for at least a year.  They are gradually increasing in size.  They are becoming more difficult to reduce.  He has had no signs or symptoms of obstruction.  He has had no prior abdominal surgery other than an appendectomy.   I believe the patient will be a good candidate for laparoscopic or robotic  bilateral inguinal hernia repair.  This is not a procedure that I performed.  I have discussed this with one of my partners, Dr. Phylliss Blakes, who does perform both laparoscopic and robotic hernia repair.  She has agreed to see the patient in consultation today while he is in the office.  I discussed this with the patient and he is in agreement.   Dr. Fredricka Bonine will dictate a report under a separate note.   Darnell Level MD [facility] Office: [phone]

## 2023-08-27 ENCOUNTER — Telehealth: Payer: Self-pay

## 2023-08-27 ENCOUNTER — Ambulatory Visit: Payer: Medicare Other | Admitting: Cardiology

## 2023-08-27 NOTE — Telephone Encounter (Signed)
Name: John Barrett  DOB: September 26, 1951  MRN: [id number]  Primary Cardiologist: None  Chart reviewed as part of pre-operative protocol coverage. The patient has an upcoming visit scheduled with Clementeen Hoof, PA  on 09/12/23 at which time clearance can be addressed in case there are any issues that would impact surgical recommendations. I added preop FYI to appointment note so that provider is aware to address at time of outpatient visit.  Per office protocol the cardiology provider should forward their finalized clearance decision and recommendations regarding antiplatelet therapy to the requesting party below.    Per office protocol, patient can hold Eliquis for 2-3 days prior to procedure.    I will route this message as FYI to requesting party and remove this message from the preop box as separate preop APP input not needed at this time.   Please call with any questions.  Napoleon Form, Leodis Rains, NP  08/27/2023, 9:47 AM

## 2023-08-27 NOTE — Telephone Encounter (Signed)
Pharmacy please advise on holding Eliquis prior to robotic bilateral inguinal hernia repair scheduled for TBD. Thank you.

## 2023-08-27 NOTE — Telephone Encounter (Signed)
Pre-operative Risk Assessment    Patient Name: John Barrett  DOB: 18-Dec-1950 MRN: [id number]      Request for Surgical Clearance    Procedure:   Robotic Bilateral Inguinal Herna Repair  Date of Surgery:  Clearance TBD                                 Surgeon:  Twana First, MD Surgeon's Group or Practice Name:  [facility] Phone number:  [phone] Fax number:  [phone]   Type of Clearance Requested:   - Medical  - Pharmacy:  Hold Apixaban (Eliquis)     Type of Anesthesia:  General    Additional requests/questions:    Garrel Ridgel   08/27/2023, 8:15 AM

## 2023-08-27 NOTE — Telephone Encounter (Signed)
Patient with diagnosis of afib on Eliquis for anticoagulation.    Procedure: Robotic Bilateral Inguinal Herna Repair   Date of procedure: TBD  CHA2DS2-VASc Score = 1  This indicates a 0.6% annual risk of stroke. The patient's score is based upon: CHF History: 0 HTN History: 0 Diabetes History: 0 Stroke History: 0 Vascular Disease History: 0 Age Score: 1 Gender Score: 0      CrCl 48mL/min Platelet count 170K  Per office protocol, patient can hold Eliquis for 2-3 days prior to procedure.    **This guidance is not considered finalized until pre-operative APP has relayed final recommendations.**

## 2023-08-29 NOTE — Telephone Encounter (Signed)
He is due for follow up visit- last seen in 2020- can you get him scheduled sometime in next 6 months please

## 2023-09-03 NOTE — Telephone Encounter (Signed)
LVM to schedule ov  

## 2023-09-12 ENCOUNTER — Encounter: Payer: Self-pay | Admitting: Physician Assistant

## 2023-09-12 ENCOUNTER — Ambulatory Visit: Payer: Medicare Other | Attending: Cardiology | Admitting: Physician Assistant

## 2023-09-12 VITALS — BP 104/60 | HR 63 | Ht 73.0 in | Wt 155.0 lb

## 2023-09-12 DIAGNOSIS — Z01810 Encounter for preprocedural cardiovascular examination: Secondary | ICD-10-CM | POA: Diagnosis not present

## 2023-09-12 DIAGNOSIS — D6869 Other thrombophilia: Secondary | ICD-10-CM | POA: Diagnosis present

## 2023-09-12 DIAGNOSIS — I480 Paroxysmal atrial fibrillation: Secondary | ICD-10-CM | POA: Insufficient documentation

## 2023-09-12 NOTE — Patient Instructions (Signed)
Medication Instructions:  Your physician recommends that you continue on your current medications as directed. Please refer to the Current Medication list given to you today.  *If you need a refill on your cardiac medications before your next appointment, please call your pharmacy*   Lab Work: None ordered   Testing/Procedures: None ordered   Follow-Up: At [facility], you and your health needs are our priority.  As part of our continuing mission to provide you with exceptional heart care, we have created designated Provider Care Teams.  These Care Teams include your primary Cardiologist (physician) and Advanced Practice Providers (APPs -  Physician Assistants and Nurse Practitioners) who all work together to provide you with the care you need, when you need it.   Your next appointment:   6 month(s)  The format for your next appointment:   In Person  Provider:   Loman Brooklyn, MD or Francis Dowse, PA-C{  Thank you for choosing [facility]!!   [phone]

## 2023-09-12 NOTE — Progress Notes (Signed)
Cardiology Office Note:  .   Date:  09/12/2023  ID:  John Barrett, DOB 02/04/1951, MRN [id number] PCP: Shelva Majestic, MD  [facility] Providers Cardiologist:  None Electrophysiologist:  Will Jorja Loa, MD {  History of Present Illness: .   John Barrett is a 72 y.o. male w/PMHx of HLD, AFib  He saw Dr. Elberta Fortis 02/17/23, he was doing well, struggling with prostate issues, maintaining SR, though energy not quite perfect, much better post ablation/with SR Felt a reasonable cardiac candidate for upcoming prostate surgery  Pending robotic  b/l inguinal hernia repair, again need cardiac evaluation pre-op  Today's visit is scheduled as a 75mo and pre-op evaluation  RCRI score is zero (0.4%) RPH has addressed Eliquis, hold for 2-3 days pre-op  ROS:   He is doing well Goes to the gym daily, gets on avg 14,000 steps in a day Reports excellent exertional capacity No CP, SOB, DOE No dizziness, near syncope or syncope  Since his ablation he has not had any AFib and reports his symptoms were clear and he feels he would know if he did He reports the only time he has had an awareness of his heart beat was 2 days prior to the election, had become very upset and really got into what he would call a panic attack heart was racing, perhaps not like his AFib Lasted a few minutes until he was able to get himself calmed down and it settled  He would like to be able to come off the eliquis, though if Dr. Elberta Fortis felt he should stay on it that would be OK to   Arrhythmia/AAD hx AFib dx Aug 2023 (in the environment of ETOH abuse) PVI ablation 11/20/22  Studies Reviewed: Marland Kitchen    EKG done today and reviewed by myself:  SR 63bpm, no changes noted from prior  11/20/22: EPS/ablation CONCLUSIONS: 1. Atrial fibrillation upon presentation.   2. Successful electrical isolation and anatomical encircling of all four pulmonary veins with radiofrequency current.  A WACA approach  was used 3. Additional left atrial ablation was performed with a standard box lesion created along the posterior wall of the left atrium 4. Atrial fibrillation successfully cardioverted to sinus rhythm. 5. No early apparent complications.   11/13/22: cardiac CT IMPRESSION: 1. Pulmonary vein measurements as reported. 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  There is no thrombus in the left atrial appendage. 3. Esophagus courses posterior to ostium of accessory right superior pulmonary vein 4.  Dilated main pulmonary artery measuring 31mm 5.  Dilated ascending aorta measuring 40mm 6.  Coronary calcium score 0   Risk Assessment/Calculations:    Physical Exam:   VS:  There were no vitals taken for this visit.   Wt Readings from Last 3 Encounters:  07/07/23 155 lb (70.3 kg)  06/12/23 151 lb 2 oz (68.5 kg)  05/29/23 166 lb (75.3 kg)    GEN: Well nourished, well developed in no acute distress NECK: No JVD; No carotid bruits CARDIAC: RRR, no murmurs, rubs, gallops RESPIRATORY:  CTA b/l without rales, wheezing or rhonchi  ABDOMEN: Soft, non-tender, non-distended EXTREMITIES:  No edema; No deformity    ASSESSMENT AND PLAN: .    Persistent  AFib CHA2DS2Vasc is one on Eliquis appropriately dosed As above Low if any AFib post ablation   Had a brief episode of palpitations last week, felt triggered as discussed above Will reach out to Dr. Elberta Fortis his  thoughts/recommendations on his OAC  Secondary hypercoagulable state 2/2 AFib  4.   Pre-op Low cardiac risk score Excellent exertional capacity without symptoms No cardiac contraindications to inguinal hernia surgery OK to hold eliquis 2-3 days as discussed by [facility]    Dispo: 6 moths, sooner if needed  Signed, Sheilah Pigeon, PA-C

## 2023-09-13 ENCOUNTER — Encounter: Payer: Self-pay | Admitting: Cardiology

## 2023-10-14 NOTE — Progress Notes (Addendum)
  COVID Vaccine Completed:  Yes  Date of COVID positive in last 90 days:  No  PCP - Tana Conch, MD Cardiologist - Loman Brooklyn, MD  Cardiac clearance in Epic dated 09-12-23 by Norberto Sorenson, PA-C  Chest x-ray - N/A EKG - 09-12-23 Epic Stress Test - N/A ECHO - 06-19-22 [facility] Cardiac Cath - N/A Pacemaker/ICD device last checked: Spinal Cord Stimulator:  N/A Afib ablation - 11-20-22 Epic Cardiac CT - 11-13-22 Epic  Bowel Prep - N/A  Sleep Study - N/A CPAP -   Fasting Blood Sugar - N/A Checks Blood Sugar _____ times a day  Last dose of GLP1 agonist-  N/A GLP1 instructions:  Hold 7 days before surgery    Last dose of SGLT-2 inhibitors-  N/A SGLT-2 instructions:  Hold 3 days before surgery    Blood Thinner Instructions:  Eliquis  Hold 3 days Patient is aware.  Aspirin Instructions: Last Dose:  Activity level:  Can go up a flight of stairs and perform activities of daily living without stopping and without symptoms of chest pain or shortness of breath.   Able to exercise without symptoms, patient walks daily with no issues.    Anesthesia review:  Afib, aortia dilation,   Patient denies shortness of breath, fever, cough and chest pain at PAT appointment  Patient verbalized understanding of instructions that were given to them at the PAT appointment. Patient was also instructed that they will need to review over the PAT instructions again at home before surgery.

## 2023-10-14 NOTE — Patient Instructions (Signed)
SURGICAL WAITING ROOM VISITATION Patients having surgery or a procedure may have no more than 2 support people in the waiting area - these visitors may rotate.    Children under the age of 57 must have an adult with them who is not the patient.  If the patient needs to stay at the hospital during part of their recovery, the visitor guidelines for inpatient rooms apply. Pre-op nurse will coordinate an appropriate time for 1 support person to accompany patient in pre-op.  This support person may not rotate.    Please refer to the [facility] website for the visitor guidelines for Inpatients (after your surgery is over and you are in a regular room).       Your procedure is scheduled on: 11-03-23   Report to [facility] Main Entrance    Report to admitting at 11:15 AM   Call this number if you have problems the morning of surgery [phone]   Do not eat food or drink liquids :After Midnight.           If you have questions, please contact your surgeon's office.   FOLLOW  ANY ADDITIONAL PRE OP INSTRUCTIONS YOU RECEIVED FROM YOUR SURGEON'S OFFICE!!!     Oral Hygiene is also important to reduce your risk of infection.                                    Remember - BRUSH YOUR TEETH THE MORNING OF SURGERY WITH YOUR REGULAR TOOTHPASTE   Do NOT smoke after Midnight   Take these medicines the morning of surgery with A SIP OF WATER:  None  Stop all vitamins and herbal supplements 7 days before surgery  Bring CPAP mask and tubing day of surgery.                            You may not have any metal on your body including  jewelry, and body piercing             Do not wear lotions, powders, cologne, or deodorant              Men may shave face and neck.   Do not bring valuables to the hospital. [facility] IS NOT RESPONSIBLE   FOR VALUABLES.   Contacts, dentures or bridgework may not be worn into surgery.  DO NOT BRING YOUR HOME MEDICATIONS TO THE HOSPITAL. PHARMACY WILL  DISPENSE MEDICATIONS LISTED ON YOUR MEDICATION LIST TO YOU DURING YOUR ADMISSION IN THE HOSPITAL!    Patients discharged on the day of surgery will not be allowed to drive home.  Someone NEEDS to stay with you for the first 24 hours after anesthesia.   Special Instructions: Bring a copy of your healthcare power of attorney and living will documents the day of surgery if you haven't scanned them before.              Please read over the following fact sheets you were given: IF YOU HAVE QUESTIONS ABOUT YOUR PRE-OP INSTRUCTIONS PLEASE CALL [phone] Gwen  If you received a COVID test during your pre-op visit  it is requested that you wear a mask when out in public, stay away from anyone that may not be feeling well and notify your surgeon if you develop symptoms. If you test positive for Covid or have been in contact with  anyone that has tested positive in the last 10 days please notify you surgeon.  [facility] - Preparing for Surgery Before surgery, you can play an important role.  Because skin is not sterile, your skin needs to be as free of germs as possible.  You can reduce the number of germs on your skin by washing with CHG (chlorahexidine gluconate) soap before surgery.  CHG is an antiseptic cleaner which kills germs and bonds with the skin to continue killing germs even after washing. Please DO NOT use if you have an allergy to CHG or antibacterial soaps.  If your skin becomes reddened/irritated stop using the CHG and inform your nurse when you arrive at Short Stay. Do not shave (including legs and underarms) for at least 48 hours prior to the first CHG shower.  You may shave your face/neck.  Please follow these instructions carefully:  1.  Shower with CHG Soap the night before surgery and the  morning of surgery.  2.  If you choose to wash your hair, wash your hair first as usual with your normal  shampoo.  3.  After you shampoo, rinse your hair and body thoroughly to remove the shampoo.                              4.  Use CHG as you would any other liquid soap.  You can apply chg directly to the skin and wash.  Gently with a scrungie or clean washcloth.  5.  Apply the CHG Soap to your body ONLY FROM THE NECK DOWN.   Do   not use on face/ open                           Wound or open sores. Avoid contact with eyes, ears mouth and   genitals (private parts).                       Wash face,  Genitals (private parts) with your normal soap.             6.  Wash thoroughly, paying special attention to the area where your    surgery  will be performed.  7.  Thoroughly rinse your body with warm water from the neck down.  8.  DO NOT shower/wash with your normal soap after using and rinsing off the CHG Soap.                9.  Pat yourself dry with a clean towel.            10.  Wear clean pajamas.            11.  Place clean sheets on your bed the night of your first shower and do not  sleep with pets. Day of Surgery : Do not apply any lotions/deodorants the morning of surgery.  Please wear clean clothes to the hospital/surgery center.  FAILURE TO FOLLOW THESE INSTRUCTIONS MAY RESULT IN THE CANCELLATION OF YOUR SURGERY  PATIENT SIGNATURE_________________________________  NURSE SIGNATURE__________________________________  ________________________________________________________________________

## 2023-10-15 ENCOUNTER — Encounter (HOSPITAL_COMMUNITY)
Admission: RE | Admit: 2023-10-15 | Discharge: 2023-10-15 | Disposition: A | Discharge status: Home / Self Care | Payer: Medicare Other | Source: Ambulatory Visit | Attending: Surgery | Admitting: Surgery

## 2023-10-15 ENCOUNTER — Encounter (HOSPITAL_COMMUNITY): Payer: Self-pay

## 2023-10-15 ENCOUNTER — Other Ambulatory Visit: Payer: Self-pay

## 2023-10-15 VITALS — BP 124/81 | HR 72 | Temp 98.1°F | Resp 16 | Ht 73.0 in | Wt 153.8 lb

## 2023-10-15 DIAGNOSIS — Z01818 Encounter for other preprocedural examination: Secondary | ICD-10-CM | POA: Diagnosis present

## 2023-10-15 DIAGNOSIS — Z01812 Encounter for preprocedural laboratory examination: Secondary | ICD-10-CM | POA: Diagnosis not present

## 2023-10-15 DIAGNOSIS — I2510 Atherosclerotic heart disease of native coronary artery without angina pectoris: Secondary | ICD-10-CM | POA: Insufficient documentation

## 2023-10-15 HISTORY — DX: Squamous cell carcinoma of skin of left lower limb, including hip: C44.729

## 2023-10-15 LAB — BASIC METABOLIC PANEL
Anion gap: 7 (ref 5–15)
BUN: 21 mg/dL (ref 8–23)
CO2: 26 mmol/L (ref 22–32)
Calcium: 8.8 mg/dL — ABNORMAL LOW (ref 8.9–10.3)
Chloride: 103 mmol/L (ref 98–111)
Creatinine, Ser: 0.85 mg/dL (ref 0.61–1.24)
GFR, Estimated: >60 mL/min (ref >60)
Glucose, Bld: 95 mg/dL (ref 70–99)
Potassium: 4.3 mmol/L (ref 3.5–5.1)
Sodium: 136 mmol/L (ref 135–145)

## 2023-10-15 LAB — CBC
HCT: 47 % (ref 39.0–52.0)
Hemoglobin: 15.2 g/dL (ref 13.0–17.0)
MCH: 29.9 pg (ref 26.0–34.0)
MCHC: 32.3 g/dL (ref 30.0–36.0)
MCV: 92.5 fL (ref 80.0–100.0)
Platelets: 185 10*3/uL (ref 150–400)
RBC: 5.08 MIL/uL (ref 4.22–5.81)
RDW: 14 % (ref 11.5–15.5)
WBC: 6.4 10*3/uL (ref 4.0–10.5)
nRBC: 0 % (ref 0.0–0.2)

## 2023-10-16 NOTE — Progress Notes (Signed)
Anesthesia Chart Review   Case: [id number] Date/Time: 11/03/23 1315   Procedure: XI ROBOTIC ASSISTED BILATERAL INGUINAL HERNIA (Bilateral) - 120 ROOM 2   Anesthesia type: General   Pre-op diagnosis: BILATERAL INGUINAL HERNIA   Location[facility] ROOM 02 / [facility]   Surgeons: Berna Bue, MD       DISCUSSION:72 y.o. never smoker with h/o atrial fibrillation s/p ablation 11/20/2022, h/o alcohol abuse, bilateral inguinal hernia scheduled for above procedure 11/03/2023 with Dr. Phylliss Blakes.   Pt last seen by cardiology 09/12/2023.  Per OV note, "Low cardiac risk score Excellent exertional capacity without symptoms No cardiac contraindications to inguinal hernia surgery OK to hold eliquis 2-3 days as discussed by [facility]"  VS: There were no vitals taken for this visit.  PROVIDERS: Shelva Majestic, MD is PCP   Cardiologist - Loman Brooklyn, MD  LABS: Labs reviewed: Acceptable for surgery. (all labs ordered are listed, but only abnormal results are displayed)  Labs Reviewed - No data to display   IMAGES: 11/13/2022 CT CARDIAC MORPH/PULM VEIN W/CM&W/O CA SCORE  IMPRESSION: 1. Pulmonary vein measurements as reported. There is a small accessory right superior pulmonary vein, with ostium area measuring 0.5cm^2   2.  There is no thrombus in the left atrial appendage.   3. Esophagus courses posterior to ostium of accessory right superior pulmonary vein   4.  Dilated main pulmonary artery measuring 31mm   5.  Dilated ascending aorta measuring 40mm   6.  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    Hemorrhoids    a.) s/p banding   History of adenomatous polyp of colon    q5 years   History of skin cancer    basal cell in past. Dr. Jorja Loa  arm, chest   Inguinal hernia recurrent bilateral    Long term current use of anticoagulant    a.) apixaban   Osteoarthritis    Psoriasis    has had to use orals in the past. Mainly controlled with triamcinolone per Dr. Jorja Loa   RLS (restless legs syndrome)    Rosacea    hydrocortisone OTC per Dr. Jorja Loa   Shingles    x2   Soft tissue mass    left lower leg. removed by Dr. 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, left     Past Surgical History:  Procedure Laterality Date   APPENDECTOMY     ATRIAL FIBRILLATION ABLATION N/A 11/20/2022   Procedure: ATRIAL FIBRILLATION ABLATION;  Surgeon: Regan Lemming, MD;  [facility];  Service: Cardiovascular;  Laterality: N/A;   COLONOSCOPY  12/2014   Russella Dar - TA polyps    HEMORRHOID BANDING  2008   in office   HOLEP-LASER ENUCLEATION OF THE PROSTATE WITH MORCELLATION N/A 03/14/2023   Procedure: HOLEP-LASER ENUCLEATION OF THE PROSTATE WITH MORCELLATION;  Surgeon: Sondra Come, MD;  Location: [facility] ORS;  Service: Urology;  Laterality: N/A;   MASS EXCISION Left 07/29/2014   Procedure: EXCISION SOFT TISSUE MASS LEFT LOWER LEG;  Surgeon: Darnell Level, MD;  Location: [facility];  Service: General;  Laterality: Left;   MOHS SURGERY Left    Calf   POLYPECTOMY     TONSILLECTOMY AND ADENOIDECTOMY     VEIN LIGATION      of penis. cialis in past for ED related to vein ligation/leak and BPH x2    MEDICATIONS:  0.9 %  sodium chloride infusion    Clobetasol Prop Emollient Base 0.05 % emollient cream   ELIQUIS 5 MG TABS tablet   Multiple Vitamin (MULTIVITAMIN) capsule   Omega-3 Fatty Acids (FISH OIL) 1200 MG CAPS   Polyethyl Glycol-Propyl Glycol (SYSTANE ULTRA) 0.4-0.3 % SOLN     Puanani Gene Ward, PA-C [facility] Pre-Surgical Testing [phone]

## 2023-10-16 NOTE — Anesthesia Preprocedure Evaluation (Signed)
Anesthesia Evaluation    Airway        Dental   Pulmonary           Cardiovascular      Neuro/Psych    GI/Hepatic   Endo/Other    Renal/GU      Musculoskeletal   Abdominal   Peds  Hematology   Anesthesia Other Findings   Reproductive/Obstetrics                             Anesthesia Physical Anesthesia Plan  ASA:   Anesthesia Plan:    Post-op Pain Management:    Induction:   PONV Risk Score and Plan:   Airway Management Planned:   Additional Equipment:   Intra-op Plan:   Post-operative Plan:   Informed Consent:   Plan Discussed with:   Anesthesia Plan Comments: (See PAT note 10/16/2023)       Anesthesia Quick Evaluation

## 2023-10-21 ENCOUNTER — Other Ambulatory Visit: Payer: Self-pay | Admitting: Cardiology

## 2023-10-21 DIAGNOSIS — I480 Paroxysmal atrial fibrillation: Secondary | ICD-10-CM

## 2023-10-23 NOTE — Telephone Encounter (Signed)
Prescription refill request for Eliquis received. Indication: a fib Last office visit: 09/12/23 Scr: 0.85 10/15/23 epic Age: 72 Weight: 70kg

## 2023-10-24 ENCOUNTER — Other Ambulatory Visit: Payer: Medicare Other

## 2023-10-24 DIAGNOSIS — Z125 Encounter for screening for malignant neoplasm of prostate: Secondary | ICD-10-CM

## 2023-10-24 DIAGNOSIS — N138 Other obstructive and reflux uropathy: Secondary | ICD-10-CM

## 2023-10-25 LAB — PSA: Prostate Specific Ag, Serum: 0.3 ng/mL (ref 0.0–4.0)

## 2023-10-30 ENCOUNTER — Ambulatory Visit: Payer: Medicare Other | Admitting: Urology

## 2023-10-30 VITALS — BP 107/71 | HR 73 | Ht 73.0 in | Wt 157.0 lb

## 2023-10-30 DIAGNOSIS — N138 Other obstructive and reflux uropathy: Secondary | ICD-10-CM

## 2023-10-30 DIAGNOSIS — N401 Enlarged prostate with lower urinary tract symptoms: Secondary | ICD-10-CM

## 2023-10-30 DIAGNOSIS — Z125 Encounter for screening for malignant neoplasm of prostate: Secondary | ICD-10-CM | POA: Diagnosis not present

## 2023-10-30 LAB — BLADDER SCAN AMB NON-IMAGING

## 2023-10-30 NOTE — Progress Notes (Signed)
   10/30/2023 3:11 PM   John Barrett 01-31-1951 [id number]  Reason for visit: Follow up BPH status post HOLEP, PSA monitoring  HPI: 73 year old male with long history of obstructive urinary symptoms, nocturia 5-6 times per night, history of retention and CIC who underwent a uncomplicated HOLEP on 03/14/2023 with removal of 101g of benign prostate tissue.  He was referred by Dr. 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  He has been doing well since that time.  PVR today normal at 2ml.  No significant urinary complaints today.  Voiding with a strong stream, no significant incontinence.  Wearing a shield overnight for a few drops of leakage that is minimally bothersome.  Denies any stress or urge incontinence.  Urgency and frequency during the day has resolved.  PSA from 10/24/2023 was 0.3, very low as expected post HOLEP and reassurance provided.  We reviewed the AUA guidelines that do not recommend routine PSA screening in men over age 49.  RTC 1 year PVR, if doing well at that time likely can follow-up as needed  John JAYSON Burnet, MD  [facility] [address] [place], [place] [address] [phone]

## 2023-11-03 ENCOUNTER — Ambulatory Visit (HOSPITAL_COMMUNITY)
Admission: RE | Admit: 2023-11-03 | Discharge: 2023-11-03 | Disposition: A | Discharge status: Home / Self Care | Payer: Medicare Other | Attending: Surgery | Admitting: Surgery

## 2023-11-03 ENCOUNTER — Encounter (HOSPITAL_COMMUNITY)
Admission: RE | Disposition: A | Discharge status: Home / Self Care | Payer: Self-pay | Source: Home / Self Care | Attending: Surgery

## 2023-11-03 ENCOUNTER — Other Ambulatory Visit: Payer: Self-pay

## 2023-11-03 ENCOUNTER — Ambulatory Visit (HOSPITAL_COMMUNITY): Payer: Medicare Other | Admitting: Physician Assistant

## 2023-11-03 ENCOUNTER — Ambulatory Visit (HOSPITAL_BASED_OUTPATIENT_CLINIC_OR_DEPARTMENT_OTHER): Payer: Medicare Other | Admitting: Physician Assistant

## 2023-11-03 ENCOUNTER — Encounter (HOSPITAL_COMMUNITY): Payer: Self-pay | Admitting: Surgery

## 2023-11-03 DIAGNOSIS — Z7901 Long term (current) use of anticoagulants: Secondary | ICD-10-CM | POA: Diagnosis not present

## 2023-11-03 DIAGNOSIS — K4020 Bilateral inguinal hernia, without obstruction or gangrene, not specified as recurrent: Secondary | ICD-10-CM | POA: Insufficient documentation

## 2023-11-03 DIAGNOSIS — Z9049 Acquired absence of other specified parts of digestive tract: Secondary | ICD-10-CM | POA: Insufficient documentation

## 2023-11-03 DIAGNOSIS — I4891 Unspecified atrial fibrillation: Secondary | ICD-10-CM | POA: Diagnosis not present

## 2023-11-03 SURGERY — REPAIR, HERNIA, INGUINAL, BILATERAL, ROBOT-ASSISTED
Anesthesia: General | Site: Abdomen | Laterality: Bilateral

## 2023-11-03 MED ORDER — ROCURONIUM BROMIDE 10 MG/ML (PF) SYRINGE
PREFILLED_SYRINGE | INTRAVENOUS | Status: DC | PRN
  Administered 2023-11-03: 60 mg via INTRAVENOUS
  Administered 2023-11-03: 20 mg via INTRAVENOUS

## 2023-11-03 MED ORDER — PHENYLEPHRINE 80 MCG/ML (10ML) SYRINGE FOR IV PUSH (FOR BLOOD PRESSURE SUPPORT)
PREFILLED_SYRINGE | INTRAVENOUS | Status: DC | PRN
  Administered 2023-11-03: 80 ug via INTRAVENOUS

## 2023-11-03 MED ORDER — OXYCODONE HCL 5 MG PO TABS: 5.0000 mg | ORAL_TABLET | Freq: Three times a day (TID) | ORAL | 0 refills | Status: AC | PRN

## 2023-11-03 MED ORDER — BUPIVACAINE-EPINEPHRINE 0.25% -1:200000 IJ SOLN
INTRAMUSCULAR | Status: DC | PRN
  Administered 2023-11-03: 30 mL

## 2023-11-03 MED ORDER — LIDOCAINE HCL (PF) 2 % IJ SOLN
INTRAMUSCULAR | Status: AC
Start: 2023-11-03 — End: ?
  Filled 2023-11-03: qty 5

## 2023-11-03 MED ORDER — ONDANSETRON HCL 4 MG/2ML IJ SOLN
INTRAMUSCULAR | Status: AC
  Filled 2023-11-03: qty 2

## 2023-11-03 MED ORDER — GABAPENTIN 300 MG PO CAPS
300.0000 mg | ORAL_CAPSULE | ORAL | Status: AC
  Administered 2023-11-03: 300 mg via ORAL
  Filled 2023-11-03: qty 1

## 2023-11-03 MED ORDER — ONDANSETRON HCL 4 MG/2ML IJ SOLN
INTRAMUSCULAR | Status: DC | PRN
  Administered 2023-11-03: 4 mg via INTRAVENOUS

## 2023-11-03 MED ORDER — MIDAZOLAM HCL 2 MG/2ML IJ SOLN
INTRAMUSCULAR | Status: DC | PRN
  Administered 2023-11-03: 2 mg via INTRAVENOUS

## 2023-11-03 MED ORDER — CHLORHEXIDINE GLUCONATE 4 % EX SOLN: 60.0000 mL | Freq: Once | CUTANEOUS | Status: DC

## 2023-11-03 MED ORDER — BUPIVACAINE LIPOSOME 1.3 % IJ SUSP: 20.0000 mL | Freq: Once | INTRAMUSCULAR | Status: DC

## 2023-11-03 MED ORDER — ROCURONIUM BROMIDE 10 MG/ML (PF) SYRINGE
PREFILLED_SYRINGE | INTRAVENOUS | Status: AC
Start: 2023-11-03 — End: ?
  Filled 2023-11-03: qty 10

## 2023-11-03 MED ORDER — PROPOFOL 10 MG/ML IV BOLUS
INTRAVENOUS | Status: AC
Start: 2023-11-03 — End: ?
  Filled 2023-11-03: qty 20

## 2023-11-03 MED ORDER — BUPIVACAINE-EPINEPHRINE 0.25% -1:200000 IJ SOLN
INTRAMUSCULAR | Status: AC
  Filled 2023-11-03: qty 1

## 2023-11-03 MED ORDER — EPHEDRINE SULFATE-NACL 50-0.9 MG/10ML-% IV SOSY
PREFILLED_SYRINGE | INTRAVENOUS | Status: DC | PRN
  Administered 2023-11-03: 10 mg via INTRAVENOUS

## 2023-11-03 MED ORDER — STERILE WATER FOR IRRIGATION IR SOLN
Status: DC | PRN
  Administered 2023-11-03: 1000 mL

## 2023-11-03 MED ORDER — ONDANSETRON HCL 4 MG/2ML IJ SOLN: 4.0000 mg | Freq: Once | INTRAMUSCULAR | Status: DC | PRN

## 2023-11-03 MED ORDER — MIDAZOLAM HCL 2 MG/2ML IJ SOLN
INTRAMUSCULAR | Status: AC
  Filled 2023-11-03: qty 2

## 2023-11-03 MED ORDER — SUGAMMADEX SODIUM 200 MG/2ML IV SOLN
INTRAVENOUS | Status: DC | PRN
  Administered 2023-11-03: 150 mg via INTRAVENOUS

## 2023-11-03 MED ORDER — DEXAMETHASONE SODIUM PHOSPHATE 10 MG/ML IJ SOLN
INTRAMUSCULAR | Status: DC | PRN
  Administered 2023-11-03: 4 mg via INTRAVENOUS

## 2023-11-03 MED ORDER — AMISULPRIDE (ANTIEMETIC) 5 MG/2ML IV SOLN: 10.0000 mg | Freq: Once | INTRAVENOUS | Status: DC | PRN

## 2023-11-03 MED ORDER — ACETAMINOPHEN 500 MG PO TABS
1000.0000 mg | ORAL_TABLET | ORAL | Status: AC
  Administered 2023-11-03: 1000 mg via ORAL
  Filled 2023-11-03: qty 2

## 2023-11-03 MED ORDER — ORAL CARE MOUTH RINSE: 15.0000 mL | Freq: Once | OROMUCOSAL | Status: AC

## 2023-11-03 MED ORDER — BUPIVACAINE LIPOSOME 1.3 % IJ SUSP
INTRAMUSCULAR | Status: AC
Start: 2023-11-03 — End: ?
  Filled 2023-11-03: qty 20

## 2023-11-03 MED ORDER — LIDOCAINE 2% (20 MG/ML) 5 ML SYRINGE
INTRAMUSCULAR | Status: DC | PRN
  Administered 2023-11-03: 40 mg via INTRAVENOUS

## 2023-11-03 MED ORDER — PROPOFOL 10 MG/ML IV BOLUS
INTRAVENOUS | Status: DC | PRN
  Administered 2023-11-03: 160 mg via INTRAVENOUS

## 2023-11-03 MED ORDER — FENTANYL CITRATE (PF) 100 MCG/2ML IJ SOLN
INTRAMUSCULAR | Status: DC | PRN
  Administered 2023-11-03 (×2): 25 ug via INTRAVENOUS

## 2023-11-03 MED ORDER — BUPIVACAINE LIPOSOME 1.3 % IJ SUSP
INTRAMUSCULAR | Status: DC | PRN
  Administered 2023-11-03: 20 mL

## 2023-11-03 MED ORDER — CEFAZOLIN SODIUM-DEXTROSE 2-4 GM/100ML-% IV SOLN
2.0000 g | INTRAVENOUS | Status: AC
  Administered 2023-11-03: 2 g via INTRAVENOUS
  Filled 2023-11-03: qty 100

## 2023-11-03 MED ORDER — FENTANYL CITRATE (PF) 100 MCG/2ML IJ SOLN
INTRAMUSCULAR | Status: AC
  Filled 2023-11-03: qty 2

## 2023-11-03 MED ORDER — FENTANYL CITRATE PF 50 MCG/ML IJ SOSY: 25.0000 ug | PREFILLED_SYRINGE | INTRAMUSCULAR | Status: DC | PRN

## 2023-11-03 MED ORDER — CHLORHEXIDINE GLUCONATE 0.12 % MT SOLN
15.0000 mL | Freq: Once | OROMUCOSAL | Status: AC
  Administered 2023-11-03: 15 mL via OROMUCOSAL

## 2023-11-03 MED ORDER — LACTATED RINGERS IV SOLN: INTRAVENOUS | Status: DC | PRN

## 2023-11-03 MED ORDER — DEXAMETHASONE SODIUM PHOSPHATE 10 MG/ML IJ SOLN
INTRAMUSCULAR | Status: AC
  Filled 2023-11-03: qty 1

## 2023-11-03 MED ORDER — DOCUSATE SODIUM 100 MG PO CAPS: 100.0000 mg | ORAL_CAPSULE | Freq: Two times a day (BID) | ORAL | 0 refills | Status: AC

## 2023-11-03 SURGICAL SUPPLY — 48 items
ANTIFOG SOL W/FOAM PAD STRL (MISCELLANEOUS) ×1
APPLICATOR COTTON TIP 6 STRL (MISCELLANEOUS) ×2 IMPLANT
APPLICATOR COTTON TIP 6IN STRL (MISCELLANEOUS) ×2
BAG COUNTER SPONGE SURGICOUNT (BAG) IMPLANT
BLADE SURG SZ11 CARB STEEL (BLADE) ×1 IMPLANT
CHLORAPREP W/TINT 26 (MISCELLANEOUS) ×1 IMPLANT
COVER SURGICAL LIGHT HANDLE (MISCELLANEOUS) ×1 IMPLANT
COVER TIP SHEARS 8 DVNC (MISCELLANEOUS) ×1 IMPLANT
DERMABOND ADVANCED .7 DNX12 (GAUZE/BANDAGES/DRESSINGS) IMPLANT
DRAPE ARM DVNC X/XI (DISPOSABLE) ×4 IMPLANT
DRAPE COLUMN DVNC XI (DISPOSABLE) ×1 IMPLANT
DRIVER NDL LRG 8 DVNC XI (INSTRUMENTS) ×1 IMPLANT
DRIVER NDL MEGA SUTCUT DVNCXI (INSTRUMENTS) ×2 IMPLANT
DRIVER NDLE LRG 8 DVNC XI (INSTRUMENTS) ×1
DRIVER NDLE MEGA SUTCUT DVNCXI (INSTRUMENTS) ×2
ELECT REM PT RETURN 15FT ADLT (MISCELLANEOUS) ×1 IMPLANT
GLOVE BIO SURGEON STRL SZ 6 (GLOVE) ×2 IMPLANT
GLOVE INDICATOR 6.5 STRL GRN (GLOVE) ×2 IMPLANT
GLOVE SS BIOGEL STRL SZ 6 (GLOVE) ×1 IMPLANT
GOWN STRL REUS W/ TWL LRG LVL3 (GOWN DISPOSABLE) ×2 IMPLANT
GOWN STRL REUS W/ TWL XL LVL3 (GOWN DISPOSABLE) IMPLANT
GRASPER TIP-UP FEN DVNC XI (INSTRUMENTS) ×1 IMPLANT
IRRIG SUCT STRYKERFLOW 2 WTIP (MISCELLANEOUS)
IRRIGATION SUCT STRKRFLW 2 WTP (MISCELLANEOUS) IMPLANT
KIT BASIN OR (CUSTOM PROCEDURE TRAY) ×1 IMPLANT
KIT TURNOVER KIT A (KITS) IMPLANT
MESH 3DMAX MID 4X6 LT LRG (Mesh General) IMPLANT
MESH 3DMAX MID 4X6 RT LRG (Mesh General) IMPLANT
NDL HYPO 22X1.5 SAFETY MO (MISCELLANEOUS) ×1 IMPLANT
NDL INSUFFLATION 14GA 120MM (NEEDLE) ×1 IMPLANT
NEEDLE HYPO 22X1.5 SAFETY MO (MISCELLANEOUS) ×1
NEEDLE INSUFFLATION 14GA 120MM (NEEDLE) ×1
PACK CARDIOVASCULAR III (CUSTOM PROCEDURE TRAY) ×1 IMPLANT
PAD POSITIONING PINK XL (MISCELLANEOUS) ×1 IMPLANT
SCISSORS MNPLR CVD DVNC XI (INSTRUMENTS) ×1 IMPLANT
SEAL UNIV 5-12 XI (MISCELLANEOUS) ×3 IMPLANT
SOL ELECTROSURG ANTI STICK (MISCELLANEOUS) ×1
SOLUTION ANTFG W/FOAM PAD STRL (MISCELLANEOUS) ×1 IMPLANT
SOLUTION ELECTROSURG ANTI STCK (MISCELLANEOUS) ×1 IMPLANT
SPIKE FLUID TRANSFER (MISCELLANEOUS) ×1 IMPLANT
SUT MNCRL AB 4-0 PS2 18 (SUTURE) ×1 IMPLANT
SUT VIC AB 3-0 SH 27XBRD (SUTURE) ×1 IMPLANT
SUT VLOC 180 2-0 6IN GS21 (SUTURE) ×1 IMPLANT
SUT VLOC 3-0 9IN GRN (SUTURE) IMPLANT
SYR 10ML LL (SYRINGE) ×1 IMPLANT
SYR 20ML LL LF (SYRINGE) ×1 IMPLANT
TOWEL OR 17X26 10 PK STRL BLUE (TOWEL DISPOSABLE) ×1 IMPLANT
TUBING INSUFFLATION 10FT LAP (TUBING) ×1 IMPLANT

## 2023-11-03 NOTE — Interval H&P Note (Signed)
 History and Physical Interval Note:  11/03/2023 12:00 PM  John Barrett  has presented today for surgery, with the diagnosis of BILATERAL INGUINAL HERNIA.  The various methods of treatment have been discussed with the patient and family. After consideration of risks, benefits and other options for treatment, the patient has consented to  Procedure(s) with comments: XI ROBOTIC ASSISTED BILATERAL INGUINAL HERNIA (Bilateral) - 120 ROOM 2 as a surgical intervention.  The patient's history has been reviewed, patient examined, no change in status, stable for surgery.  I have reviewed the patient's chart and labs.  Questions were answered to the patient's satisfaction.     Elfriede Bonini DELENA Freund

## 2023-11-03 NOTE — H&P (Signed)
 I met with the patient at the request of Dr. Gerrit Friends and agree he does have large bilateral inguinal hernias which would be most amenable to minimally invasive repair.  I went over the procedure with him and discussed relevant anatomy, surgical technique, use of mesh, and risks including bleeding, infection, pain, scarring, injury to intra-abdominal or retroperitoneal structures including bowel, bladder, nerves, blood vessels etc.; risk of hernia recurrence, chronic pain, hematoma or seroma, as well as general cardiovascular/pulmonary/thromboembolic complications.  We discussed typical postop recovery, timeline and activity limitations. Questions were come and answered to the patient's satisfaction. Patient wishes to proceed with scheduling.    Progress Notes Gerkin, Rudi Rummage, MD (Physician)  General Surgery Expand All Collapse All      REFERRING PHYSICIAN:  Shelva Majestic, MD   PROVIDER:  Bonnetta Barry, MD   MRN: [id number] DOB: 1951-08-02 DATE OF ENCOUNTER: 08/26/2023    Subjective   Chief Complaint: New Consultation ( Inguinal hernia)       History of Present Illness:   Patient is referred by Dr. Tana Conch for evaluation of inguinal hernia.  Patient states that the hernias have been present for approximately 1 year.  He has bilateral inguinal hernia, greater on the left than on the right.  He has been having some discomfort especially later in the day.  He is having more difficulty reducing the left-sided hernia than he had in the past.  He denies any signs or symptoms of obstruction.  He did have a recent colonoscopy.  His only previous abdominal surgery was an appendectomy as a child.  He has had transurethral prostate surgery.   Of note, the patient has a history of atrial fibrillation and is currently still on Eliquis.  However he underwent a successful ablation and remains in sinus rhythm.  He may be able to discontinue the Eliquis in the near future.     Review  of Systems: A complete review of systems was obtained from the patient.  I have reviewed this information and discussed as appropriate with the patient.  See HPI as well for other ROS.   Review of Systems  Constitutional: Negative.   HENT: Negative.    Eyes: Negative.   Respiratory: Negative.    Cardiovascular:  Positive for palpitations.  Gastrointestinal: Negative.   Genitourinary:        Bilateral inguinal herniae  Musculoskeletal: Negative.   Skin: Negative.   Neurological: Negative.   Endo/Heme/Allergies:  Bruises/bleeds easily.  Psychiatric/Behavioral: Negative.          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  BPH associated with nocturia  Elevated PSA  Erectile dysfunction  New onset atrial fibrillation (CMS/HHS-HCC)  Bilateral inguinal hernia       Past Surgical History Past Surgical History: Procedure Laterality Date  CARDIAC FOCAL ABLATION UTILIZING RADIATION THERAPY       growth on leg      LIGATION FEMORAL VEIN      prostate reduction           Allergies No Known Allergies     Medications Ordered Prior to Encounter Current Outpatient Medications on File Prior to Visit Medication Sig Dispense Refill  ELIQUIS 5 mg tablet Take 5 mg by mouth 2 (two) times daily      multivitamin  tablet Take 1 tablet by mouth once daily      omega 3-dha-epa-fish oil (FISH OIL) 1,000 (120-180) mg Cap        alfuzosin (UROXATRAL) 10 mg ER tablet Take 1 tablet by mouth every day 90 tablet 3  calcipotriene-betamethasone (ENSTILAR) 0.005-0.064 % Foam Apply to affected area(s) daily 60 g 5    No current facility-administered medications on file prior to visit.       Family History Family History Problem Relation Age of Onset  Skin cancer Mother         Tobacco Use  History Social History    Tobacco Use Smoking Status Never Smokeless Tobacco Never       Social History Social History    Socioeconomic History  Marital status: Married Tobacco Use  Smoking status: Never  Smokeless tobacco: Never Substance and Sexual Activity  Alcohol use: Not Currently  Drug use: Never    Social Drivers of Health    Financial Resource Strain: Low Risk  (06/18/2022)   Received from [facility]   Overall Financial Resource Strain (CARDIA)    Difficulty of Paying Living Expenses: Not hard at all Stress: No Stress Concern Present (06/18/2022)   Received from [facility] of Occupational Health - Occupational Stress Questionnaire    Feeling of Stress : Only a little   Received from [employer]   Social Network       Objective:      Vitals:   08/26/23 1404 BP: 127/81 Pulse: 66 Temp: 36.7 C (98 F) SpO2: 98% Weight: 68.5 kg (151 lb) Height: 185.4 cm (6\' 1" )   Body mass index is 19.92 kg/m.   Physical Exam    GENERAL APPEARANCE Comfortable, no acute issues Development: normal Gross deformities: none   SKIN Rash, lesions, ulcers: none Induration, erythema: none Nodules: none palpable   EYES Conjunctiva and lids: normal Pupils: equal and reactive   EARS, NOSE, MOUTH, THROAT External ears: no lesion or deformity External nose: no lesion or deformity Hearing: grossly normal   NECK Symmetric: yes Trachea: midline   ABDOMEN Sign of umbilical hernia.   GENITOURINARY/RECTAL Obvious bilateral inguinal hernia, likely direct.  Normal male genitalia without mass or lesion.  Palpation in the right inguinal canal shows a large bulge which is reducible with manipulation.  This augments with cough or Valsalva.  On the left the bulge is slightly larger.  With manipulation it is reducible but augments with cough and Valsalva.   MUSCULOSKELETAL Station and gait: normal Digits and nails: no clubbing or  cyanosis Muscle strength: grossly normal all extremities Range of motion: grossly normal all extremities Deformity: none   LYMPHATIC Cervical: none palpable Supraclavicular: none palpable   PSYCHIATRIC Oriented to person, place, and time: yes Mood and affect: normal for situation Judgment and insight: appropriate for situation        Assessment and Plan: Diagnoses and all orders for this visit:   Non-recurrent bilateral inguinal hernia without obstruction or gangrene     Patient is referred by his primary care physician for surgical evaluation and management of bilateral inguinal hernia.   Patient has had bilateral inguinal hernia for at least a year.  They are gradually increasing in size.  They are becoming more difficult to reduce.  He has had no signs or symptoms of obstruction.  He has had no prior abdominal surgery other than an appendectomy.   I believe the patient will be a good candidate for laparoscopic or robotic  bilateral inguinal hernia repair.  This is not a procedure that I performed.  I have discussed this with one of my partners, Dr. Phylliss Blakes, who does perform both laparoscopic and robotic hernia repair.  She has agreed to see the patient in consultation today while he is in the office.  I discussed this with the patient and he is in agreement.   Dr. Fredricka Bonine will dictate a report under a separate note.   Darnell Level MD [facility] Surgery Office: [phone]

## 2023-11-03 NOTE — Discharge Instructions (Signed)
 HERNIA REPAIR: POST OP INSTRUCTIONS   EAT Gradually transition to a high fiber diet with a fiber supplement over the next few weeks after discharge.  Start with a pureed / full liquid diet (see below)  WALK Walk an hour a day (cumulative- not all at once).  Control your pain to do that.    CONTROL PAIN Control pain so that you can walk, sleep, tolerate sneezing/coughing, and go up/down stairs.  HAVE A BOWEL MOVEMENT DAILY Keep your bowels regular to avoid problems.  OK to try a laxative to override constipation.  OK to use an antidiarrheal to slow down diarrhea.  Call if not better after 2 tries  CALL IF YOU HAVE PROBLEMS/CONCERNS Call if you are still struggling despite following these instructions. Call if you have concerns not answered by these instructions  ######################################################################    DIET: Follow a light bland diet & liquids the first 24 hours after arrival home, such as soup, liquids, starches, etc.  Be sure to drink plenty of fluids.  Quickly advance to a usual solid diet within a few days.  Avoid fast food or heavy meals initially as you are more likely to get nauseated or have irregular bowels.     Take your usually prescribed home medications unless otherwise directed.  PAIN CONTROL: Pain is best controlled by a usual combination of three different methods TOGETHER: Ice/Heat Over the counter pain medication Prescription pain medication Most patients will experience some swelling and bruising around the hernia(s) including the groin and scrotum.  Ice packs or heating pads (30-60 minutes up to 6 times a day) will help. Use ice for the first few days to help decrease swelling and bruising, then switch to heat to help relax tight/sore spots and speed recovery.  Some people prefer to use ice alone, heat alone, alternating between ice & heat.  Experiment to what works for you.  Swelling and bruising can take several weeks to resolve.    It is helpful to take an over-the-counter pain medication regularly for the first days: Naproxen (Aleve, etc)  Two 220mg  tabs twice a day OR Ibuprofen (Advil, etc) Three 200mg  tabs four times a day (every meal & bedtime) AND Acetaminophen  (Tylenol , etc) 325-650mg  four times a day (every meal & bedtime) A  prescription for pain medication should be given to you upon discharge.  Take your pain medication as prescribed, IF NEEDED.  If you are having problems/concerns with the prescription medicine (does not control pain, nausea, vomiting, rash, itching, etc), please call us  [phone] to see if we need to switch you to a different pain medicine that will work better for you and/or control your side effect better. If you need a refill on your pain medication, please contact your pharmacy.  They will contact our office to request authorization. Prescriptions will not be filled after 5 pm or on week-ends.  Avoid getting constipated.  Between the surgery and the pain medications, it is common to experience some constipation.  Increasing fluid intake and taking a fiber supplement (such as Metamucil, Citrucel, FiberCon, MiraLax, etc) 1-2 times a day regularly will usually help prevent this problem from occurring.  A mild laxative (prune juice, Milk of Magnesia, MiraLax, etc) should be taken according to package directions if there are no bowel movements after 48 hours.    Wash / shower every day, starting 2 days after surgery.  You may shower over the skin glue which is waterproof.  No rubbing, scrubbing, lotions or ointments to  incision(s). Do not soak or submerge.   Glue will flake off after about 2 weeks.  You may leave the incision open to air.  You may replace a dressing/Band-Aid to cover an incision for comfort if you wish.  Continue to shower over incision(s) after the dressing is off.  ACTIVITIES as tolerated:   You may resume regular (light) daily activities beginning the next day--such as daily  self-care, walking, climbing stairs--gradually increasing activities as tolerated.  Control your pain so that you can walk an hour a day.  If you can walk 30 minutes without difficulty, it is safe to try more intense activity such as jogging, treadmill, bicycling, low-impact aerobics, swimming, etc. Refrain from the most intensive and strenuous activity such as sit-ups, heavy lifting, contact sports, etc  Refrain from any heavy lifting or straining until 6 weeks after surgery.   DO NOT PUSH THROUGH PAIN.  Let pain be your guide: If it hurts to do something, don't do it.  Pain is your body warning you to avoid that activity for another week until the pain goes down. You may drive when you are no longer taking prescription pain medication, you can comfortably wear a seatbelt, and you can safely maneuver your car and apply brakes. You may have sexual intercourse when it is comfortable.   FOLLOW UP in our office Please call [facility] at [phone] to set up an appointment to see your surgeon in the office for a follow-up appointment approximately 2-3 weeks after your surgery. Make sure that you call for this appointment the day you arrive home to insure a convenient appointment time.  9.  If you have disability of FMLA / Family leave forms, please bring the forms to the office for processing.  (do not give to your surgeon).  WHEN TO CALL US  [phone]: Poor pain control Reactions / problems with new medications (rash/itching, nausea, etc)  Fever over 101.5 F (38.5 C) Inability to urinate Nausea and/or vomiting Worsening swelling or bruising Continued bleeding from incision. Increased pain, redness, or drainage from the incision   The clinic staff is available to answer your questions during regular business hours (8:30am-5pm).  Please don't hesitate to call and ask to speak to one of our nurses for clinical concerns.   If you have a medical emergency, go to the nearest emergency room or call  911.  A surgeon from [facility] is always on call at the hospitals in [facility], [place] [address], [place], [place]  [address] ?  [address] [address], [place], [place]   [address] MAIN: [phone] ? TOLL FREE: [phone] ? FAX: [phone] [email or web address]

## 2023-11-03 NOTE — Op Note (Signed)
 Operative Note  Lyle Niblett  [id number]  [id number]  11/03/2023   Surgeon: Mitzie Freund MD FACS   Procedure performed: Robotic (transabdominal preperitoneal) bilateral inguinal hernia repair with mesh   Preop diagnosis: Bilateral inguinal hernias Post-op diagnosis/intraop findings: Bilateral large direct inguinal hernias, bilateral small indirect inguinal hernias   Specimens: no Retained items: no  EBL: minimal cc Complications: none   Description of procedure: After confirming informed consent the patient was taken to the operating room and placed supine on operating room table where general endotracheal anesthesia was initiated, preoperative antibiotics were administered, SCDs applied, and a formal timeout was performed.  Foley catheter placement was attempted but there was no return of urine and after 2 attempts this was aborted.  The abdomen was prepped and draped in usual sterile fashion.  Peritoneal access was gained with a left subcostal Veress needle and insufflation to 15 mmHg ensued without incident.  A periumbilical 8 mm robotic trocar and camera were then inserted and the abdominal cavity inspected and confirmed to be free of injury from our entry or any gross abnormalities.  Bilateral laparoscopic assisted tap blocks were performed with Exparel  mixed with quarter percent Marcaine  with epinephrine  for postoperative pain control and under direct visualization bilateral 8 mm trocars were placed.  The robot was then docked and instruments inserted under direct visualization.  Inspection of the pelvis confirmed bilateral inguinal hernias, with a small indirect and a large direct hernia on each side.  Beginning on the left side, a peritoneal flap was developed from the medial umbilical ligament to the anterior superior iliac spine and a combination of sharp and blunt dissection with sparing use of cautery were then used to dissect the flap from the abdominal wall.  The small  indirect and large direct hernia sacs were gently bluntly reduced.  The gonadal vessels and vas deferens were peritonealized. The space of Retzius was entered medially and the plane carefully followed with gentle blunt dissection to the Cooper ligament and pubic symphysis, carefully protecting the bladder throughout this dissection.  Space was developed medially such that the mesh would not fold when the bladder failed.  There was excellent exposure of the myopectineal orifice.  A Bard 3D max large mid weight mesh was then selected and placed in the field with excellent overlap of the direct, indirect and femoral spaces.  This was secured with interrupted 3-0 Vicryl's to the Cooper's ligament as well as superiorly on either side of the inferior epigastric vessels.  The peritoneal flap was then brought back up to cover the mesh, ensuring that the mesh did not fold or buckle while doing so, and the peritoneum was closed with a running imbricating 3-0 stratafix suture.  On completion there is no exposed mesh present.   We then turned to the right side where in an identical fashion a peritoneal flap was developed from the medial umbilical ligament to the anterior superior iliac spine and a combination of sharp and blunt dissection with sparing use of cautery were then used to dissect the flap from the abdominal wall.  The small indirect and large direct hernia sacs were gently bluntly reduced.  The gonadal vessels and vas deferens were peritonealized. The space of Retzius was entered medially and the plane carefully followed with gentle blunt dissection to the Cooper ligament and pubic symphysis, carefully protecting the bladder throughout this dissection.  Space was developed medially such that the mesh would not fold when the bladder failed.  There was excellent exposure  of the myopectineal orifice.  A Bard 3D max large mid weight mesh was then selected and placed in the field with excellent overlap of the direct,  indirect and femoral spaces.  This was secured with interrupted 3-0 Vicryl's to the Cooper's ligament as well as superiorly on either side of the inferior epigastric vessels.  The peritoneal flap was then brought back up to cover the mesh, ensuring that the mesh did not fold or buckle while doing so, and the peritoneum was closed with a running imbricating 3-0 stratafix suture.  On completion there is no exposed mesh present. The abdominal cavity was once again inspected and hemostasis confirmed.  All sutures were removed and then the robot was undocked; the abdomen was desufflated and the trocars were removed.  The skin incisions were closed with subcuticular 4-0 Monocryl and Dermabond. The patient was then awakened, extubated and taken to PACU in stable condition.    All counts were correct at the completion of the case.

## 2023-11-03 NOTE — Transfer of Care (Signed)
 Immediate Anesthesia Transfer of Care Note  Patient: John Barrett  Procedure(s) Performed: XI ROBOTIC ASSISTED BILATERAL INGUINAL HERNIA (Bilateral: Abdomen)  Patient Location: PACU  Anesthesia Type:General  Level of Consciousness: drowsy  Airway & Oxygen Therapy: Patient Spontanous Breathing and Patient connected to face mask oxygen  Post-op Assessment: Report given to RN, Post -op Vital signs reviewed and stable, and Patient moving all extremities X 4  Post vital signs: Reviewed and stable  Last Vitals:  Vitals Value Taken Time  BP 136/80 11/03/23 1545  Temp 36.6 C 11/03/23 1545  Pulse 63 11/03/23 1547  Resp 14 11/03/23 1547  SpO2 100 % 11/03/23 1547  Vitals shown include unfiled device data.  Last Pain:  Vitals:   11/03/23 1146  TempSrc:   PainSc: 0-No pain         Complications: No notable events documented.

## 2023-11-03 NOTE — Anesthesia Procedure Notes (Signed)
 Procedure Name: Intubation Date/Time: 11/03/2023 1:13 PM  Performed by: Brandy Almarie BROCKS, CRNAPre-anesthesia Checklist: Patient identified, Emergency Drugs available, Suction available and Patient being monitored Patient Re-evaluated:Patient Re-evaluated prior to induction Oxygen Delivery Method: Circle system utilized Preoxygenation: Pre-oxygenation with 100% oxygen Induction Type: IV induction Ventilation: Mask ventilation without difficulty Laryngoscope Size: Mac and 4 Grade View: Grade I Tube type: Oral Tube size: 7.5 mm Number of attempts: 1 Airway Equipment and Method: Stylet Placement Confirmation: ETT inserted through vocal cords under direct vision, positive ETCO2 and breath sounds checked- equal and bilateral Secured at: 23 cm Tube secured with: Tape Dental Injury: Teeth and Oropharynx as per pre-operative assessment

## 2023-11-04 NOTE — Anesthesia Postprocedure Evaluation (Signed)
 Anesthesia Post Note  Patient: John Barrett  Procedure(s) Performed: XI ROBOTIC ASSISTED BILATERAL INGUINAL HERNIA (Bilateral: Abdomen)     Patient location during evaluation: PACU Anesthesia Type: General Level of consciousness: awake Pain management: pain level controlled Vital Signs Assessment: post-procedure vital signs reviewed and stable Respiratory status: spontaneous breathing, nonlabored ventilation and respiratory function stable Cardiovascular status: blood pressure returned to baseline and stable Postop Assessment: no apparent nausea or vomiting Anesthetic complications: no   No notable events documented.  Last Vitals:  Vitals:   11/03/23 1615 11/03/23 1630  BP: 130/67 131/74  Pulse: 64 68  Resp: 16 17  Temp: 36.6 C 36.6 C  SpO2: 100% 100%    Last Pain:  Vitals:   11/03/23 1630  TempSrc:   PainSc: 0-No pain                 Carmela Piechowski P Ashlyne Olenick

## 2024-04-06 ENCOUNTER — Encounter: Payer: Self-pay | Admitting: Cardiology

## 2024-04-06 ENCOUNTER — Ambulatory Visit: Attending: Cardiology | Admitting: Cardiology

## 2024-04-06 VITALS — BP 110/70 | HR 67 | Ht 73.0 in | Wt 159.0 lb

## 2024-04-06 DIAGNOSIS — I4819 Other persistent atrial fibrillation: Secondary | ICD-10-CM

## 2024-04-06 DIAGNOSIS — I483 Typical atrial flutter: Secondary | ICD-10-CM

## 2024-04-06 NOTE — Progress Notes (Signed)
  Electrophysiology Office Note:   Date:  04/06/2024  ID:  Ajeet Casasola, DOB 13-Feb-1951, MRN [id number]  Primary Cardiologist: None Primary Heart Failure: None Electrophysiologist: Quianna Avery Cortland Ding, MD      History of Present Illness:   John Barrett is a 73 y.o. male with h/o atrial fibrillation seen today for routine electrophysiology followup.   Since last being seen in our clinic the patient reports doing overall well.  He has noted no further episodes of atrial fibrillation.  He has not had palpitations, fatigue, shortness of breath.  He does exercise on a daily basis.  He works out with both [employer] and walks.  He gets greater than 30 minutes of exercise a day.  He is mourning the illness of his mother-in-law.  She has been living with him for 7 years and is nearing death from Parkinson's.  he denies chest pain, palpitations, dyspnea, PND, orthopnea, nausea, vomiting, dizziness, syncope, edema, weight gain, or early satiety.   Review of systems complete and found to be negative unless listed in HPI.   EP Information / Studies Reviewed:    EKG is ordered today. Personal review as below.  EKG Interpretation Date/Time:  Tuesday April 06 2024 11:44:31 EDT Ventricular Rate:  67 PR Interval:  152 QRS Duration:  90 QT Interval:  376 QTC Calculation: 397 R Axis:   85  Text Interpretation: Normal sinus rhythm Nonspecific ST abnormality When compared with ECG of 12-Sep-2023 13:42, No significant change was found Confirmed by Valree Feild ([id number]) on 04/06/2024 11:55:30 AM     Risk Assessment/Calculations:    CHA2DS2-VASc Score = 1   This indicates a 0.6% annual risk of stroke. The patient's score is based upon: CHF History: 0 HTN History: 0 Diabetes History: 0 Stroke History: 0 Vascular Disease History: 0 Age Score: 1 Gender Score: 0              Physical Exam:   VS:  BP 110/70 (BP Location: Left Arm, Patient Position: Sitting, Cuff Size: Normal)   Pulse  67   Ht 6\' 1"  (1.854 m)   Wt 159 lb (72.1 kg)   SpO2 96%   BMI 20.98 kg/m    Wt Readings from Last 3 Encounters:  04/06/24 159 lb (72.1 kg)  11/03/23 158 lb (71.7 kg)  10/30/23 157 lb (71.2 kg)     GEN: Well nourished, well developed in no acute distress NECK: No JVD; No carotid bruits CARDIAC: Regular rate and rhythm, no murmurs, rubs, gallops RESPIRATORY:  Clear to auscultation without rales, wheezing or rhonchi  ABDOMEN: Soft, non-tender, non-distended EXTREMITIES:  No edema; No deformity   ASSESSMENT AND PLAN:    1.  Persistent atrial fibrillation: Has remained in sinus rhythm.  He has had no recurrence since ablation.  He is happy with his control.  He is on Eliquis .  His stroke risk is low with a CHA2DS2-VASc of 1.  He Ardon Franklin stop his Eliquis  today.  If he goes back into atrial fibrillation, he Meggie Laseter restart and call the clinic.  Follow up with Afib Clinic in 12 months  Signed, Mckensie Scotti Cortland Ding, MD

## 2024-06-10 ENCOUNTER — Encounter: Payer: Self-pay | Admitting: Urology

## 2024-11-03 ENCOUNTER — Ambulatory Visit (INDEPENDENT_AMBULATORY_CARE_PROVIDER_SITE_OTHER): Admitting: Urology

## 2024-11-03 VITALS — BP 112/73 | HR 64 | Ht 73.0 in | Wt 159.0 lb

## 2024-11-03 DIAGNOSIS — N138 Other obstructive and reflux uropathy: Secondary | ICD-10-CM

## 2024-11-03 DIAGNOSIS — N401 Enlarged prostate with lower urinary tract symptoms: Secondary | ICD-10-CM

## 2024-11-03 LAB — BLADDER SCAN AMB NON-IMAGING

## 2024-11-03 NOTE — Progress Notes (Signed)
" ° °  11/03/2024 4:55 PM   [name] 10-Jan-1951 [id number]  Reason for visit: Follow up BPH status post HOLEP, PSA monitoring  HPI: 74 year old male with long history of obstructive urinary symptoms, nocturia 5-6 times per night, history of retention and CIC who underwent a uncomplicated HOLEP on 03/14/2023 with removal of 101g of benign prostate tissue.  He was referred by Dr. 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  He has been doing well since that time.  PVR today normal at .  No significant urinary complaints today.  Voiding with a strong stream, no significant incontinence.  Denies any stress or urge incontinence.  Nocturia 1-2 times overnight  PSA from 10/24/2023 was 0.3, very low as expected post HOLEP and reassurance provided.  We reviewed the AUA guidelines that do not recommend routine PSA screening in men over age 14.  Follow-up with urology as needed, return precautions were discussed  Redell JAYSON Burnet, MD  [facility] [address] [place], [place] [address] [phone]   "

## 2024-11-04 ENCOUNTER — Ambulatory Visit: Payer: Self-pay | Admitting: Urology
